# Patient Record
Sex: Male | Born: 1955
Health system: Southern US, Community
[De-identification: ages and names within clinical notes are randomized; demographics above are authoritative.]

## PROBLEM LIST (undated history)

## (undated) ENCOUNTER — Emergency Department (HOSPITAL_COMMUNITY): Admission: EM | Payer: 59 | Source: Home / Self Care

## (undated) DIAGNOSIS — R0981 Nasal congestion: Secondary | ICD-10-CM

## (undated) DIAGNOSIS — L723 Sebaceous cyst: Secondary | ICD-10-CM

## (undated) DIAGNOSIS — D170 Benign lipomatous neoplasm of skin and subcutaneous tissue of head, face and neck: Secondary | ICD-10-CM

## (undated) DIAGNOSIS — M109 Gout, unspecified: Secondary | ICD-10-CM

## (undated) DIAGNOSIS — I10 Essential (primary) hypertension: Secondary | ICD-10-CM

## (undated) DIAGNOSIS — K5790 Diverticulosis of intestine, part unspecified, without perforation or abscess without bleeding: Secondary | ICD-10-CM

## (undated) DIAGNOSIS — K5732 Diverticulitis of large intestine without perforation or abscess without bleeding: Secondary | ICD-10-CM

## (undated) DIAGNOSIS — K219 Gastro-esophageal reflux disease without esophagitis: Secondary | ICD-10-CM

## (undated) DIAGNOSIS — T7840XA Allergy, unspecified, initial encounter: Secondary | ICD-10-CM

## (undated) DIAGNOSIS — J439 Emphysema, unspecified: Secondary | ICD-10-CM

## (undated) HISTORY — DX: Allergy, unspecified, initial encounter: T78.40XA

## (undated) HISTORY — PX: OTHER SURGICAL HISTORY: SHX169

## (undated) HISTORY — DX: Sebaceous cyst: L72.3

## (undated) HISTORY — DX: Diverticulitis of large intestine without perforation or abscess without bleeding: K57.32

## (undated) HISTORY — DX: Nasal congestion: R09.81

## (undated) HISTORY — DX: Diverticulosis of intestine, part unspecified, without perforation or abscess without bleeding: K57.90

## (undated) HISTORY — DX: Essential (primary) hypertension: I10

## (undated) HISTORY — DX: Gastro-esophageal reflux disease without esophagitis: K21.9

## (undated) HISTORY — DX: Emphysema, unspecified: J43.9

## (undated) HISTORY — DX: Benign lipomatous neoplasm of skin and subcutaneous tissue of head, face and neck: D17.0

---

## 1978-05-10 HISTORY — PX: WISDOM TOOTH EXTRACTION: SHX21

## 2004-05-20 ENCOUNTER — Ambulatory Visit: Payer: Self-pay | Admitting: Internal Medicine

## 2004-05-27 ENCOUNTER — Ambulatory Visit: Payer: Self-pay

## 2005-09-06 ENCOUNTER — Ambulatory Visit: Payer: Self-pay | Admitting: Internal Medicine

## 2005-10-11 ENCOUNTER — Ambulatory Visit: Payer: Self-pay | Admitting: Internal Medicine

## 2006-03-22 ENCOUNTER — Ambulatory Visit: Payer: Self-pay | Admitting: Internal Medicine

## 2007-02-15 ENCOUNTER — Ambulatory Visit: Payer: Self-pay | Admitting: Internal Medicine

## 2007-02-15 DIAGNOSIS — J209 Acute bronchitis, unspecified: Secondary | ICD-10-CM

## 2007-02-15 DIAGNOSIS — E785 Hyperlipidemia, unspecified: Secondary | ICD-10-CM

## 2007-02-15 DIAGNOSIS — K219 Gastro-esophageal reflux disease without esophagitis: Secondary | ICD-10-CM | POA: Insufficient documentation

## 2007-02-15 DIAGNOSIS — K5730 Diverticulosis of large intestine without perforation or abscess without bleeding: Secondary | ICD-10-CM | POA: Insufficient documentation

## 2007-06-29 ENCOUNTER — Ambulatory Visit: Payer: Self-pay | Admitting: Internal Medicine

## 2007-06-29 DIAGNOSIS — C44599 Other specified malignant neoplasm of skin of other part of trunk: Secondary | ICD-10-CM

## 2007-06-29 DIAGNOSIS — Z87891 Personal history of nicotine dependence: Secondary | ICD-10-CM

## 2007-06-29 LAB — CONVERTED CEMR LAB
Cholesterol, target level: 200 mg/dL
LDL Goal: 130 mg/dL

## 2007-09-29 ENCOUNTER — Telehealth: Payer: Self-pay | Admitting: *Deleted

## 2007-10-12 ENCOUNTER — Encounter: Payer: Self-pay | Admitting: Internal Medicine

## 2007-10-12 LAB — CONVERTED CEMR LAB
ALT: 30 units/L (ref 0–53)
Albumin: 4.3 g/dL (ref 3.5–5.2)
Bilirubin, Direct: 0.2 mg/dL (ref 0.0–0.3)
CO2: 24 meq/L (ref 19–32)
Cholesterol: 206 mg/dL — ABNORMAL HIGH (ref 0–200)
Glucose, Bld: 92 mg/dL (ref 70–99)
HDL: 72 mg/dL (ref >39)
Hemoglobin, Urine: NEGATIVE
Ketones, ur: NEGATIVE mg/dL
MCHC: 33.4 g/dL (ref 30.0–36.0)
Nitrite: NEGATIVE
PSA: 0.48 ng/mL (ref 0.10–4.00)
Potassium: 4 meq/L (ref 3.5–5.3)
Protein, ur: NEGATIVE mg/dL
RBC: 4.97 M/uL (ref 4.22–5.81)
Sodium: 138 meq/L (ref 135–145)
Specific Gravity, Urine: 1.005 (ref 1.005–1.03)
TSH: 1.441 microintl units/mL (ref 0.350–5.50)
Total CHOL/HDL Ratio: 2.9
Urobilinogen, UA: 0.2 (ref 0.0–1.0)
VLDL: 40 mg/dL (ref 0–40)
WBC: 5.3 10*3/uL (ref 4.0–10.5)

## 2007-10-17 ENCOUNTER — Ambulatory Visit: Payer: Self-pay | Admitting: Internal Medicine

## 2008-04-18 ENCOUNTER — Ambulatory Visit: Payer: Self-pay | Admitting: Internal Medicine

## 2008-05-10 HISTORY — PX: COLONOSCOPY: SHX174

## 2009-01-07 ENCOUNTER — Ambulatory Visit: Payer: Self-pay | Admitting: Internal Medicine

## 2009-01-07 ENCOUNTER — Telehealth: Payer: Self-pay | Admitting: Gastroenterology

## 2009-01-07 DIAGNOSIS — L723 Sebaceous cyst: Secondary | ICD-10-CM

## 2009-01-07 DIAGNOSIS — C44300 Unspecified malignant neoplasm of skin of unspecified part of face: Secondary | ICD-10-CM | POA: Insufficient documentation

## 2009-01-07 DIAGNOSIS — C4491 Basal cell carcinoma of skin, unspecified: Secondary | ICD-10-CM | POA: Insufficient documentation

## 2009-01-07 HISTORY — DX: Sebaceous cyst: L72.3

## 2009-01-22 ENCOUNTER — Ambulatory Visit: Payer: Self-pay | Admitting: Gastroenterology

## 2009-01-30 ENCOUNTER — Ambulatory Visit: Payer: Self-pay | Admitting: Gastroenterology

## 2009-01-31 ENCOUNTER — Telehealth: Payer: Self-pay | Admitting: Gastroenterology

## 2009-02-10 ENCOUNTER — Ambulatory Visit: Payer: Self-pay | Admitting: Internal Medicine

## 2009-10-29 ENCOUNTER — Ambulatory Visit: Payer: Self-pay | Admitting: Family Medicine

## 2009-10-29 DIAGNOSIS — K5732 Diverticulitis of large intestine without perforation or abscess without bleeding: Secondary | ICD-10-CM

## 2009-10-29 DIAGNOSIS — R109 Unspecified abdominal pain: Secondary | ICD-10-CM

## 2009-10-29 HISTORY — DX: Diverticulitis of large intestine without perforation or abscess without bleeding: K57.32

## 2009-10-29 LAB — CONVERTED CEMR LAB
Basophils Absolute: 0 10*3/uL (ref 0.0–0.1)
Eosinophils Absolute: 0.2 10*3/uL (ref 0.0–0.7)
Lymphocytes Relative: 12.6 % (ref 12.0–46.0)
MCHC: 34.9 g/dL (ref 30.0–36.0)
MCV: 92.4 fL (ref 78.0–100.0)
Monocytes Absolute: 1.1 10*3/uL — ABNORMAL HIGH (ref 0.1–1.0)
Neutrophils Relative %: 76.5 % (ref 43.0–77.0)
Platelets: 225 10*3/uL (ref 150.0–400.0)
RBC: 4.53 M/uL (ref 4.22–5.81)
RDW: 12.6 % (ref 11.5–14.6)
Sed Rate: 13 mm/hr (ref 0–22)

## 2010-06-09 NOTE — Assessment & Plan Note (Signed)
Summary: diverticulitis/dm   Vital Signs:  Patient profile:   55 year old male Height:      70 inches (177.80 cm) Weight:      180 pounds (81.82 kg) BMI:     25.92 O2 Sat:      97 % on Room air Temp:     98.9 degrees F (37.17 degrees C) oral Pulse rate:   82 / minute BP sitting:   138 / 82  (left arm) Cuff size:   regular  Vitals Entered By: Josph Macho RMA (October 29, 2009 10:05 AM)  O2 Flow:  Room air CC: Diverticulitis X5 days- cramping and discomfort- pt states he is very careful with what he eats since knowing he has diverticulitis/ pt states he is no longer taking Saw Palmetto/ CF Is Patient Diabetic? No   History of Present Illness: Patient in today with worsening abdominal pain. He first noted some discomfort after a loose bowel movement almost 5 days ago. He woke up and had a loose but non bloody bowel movement and then noted some abdominal cramping. Since then he has had persistent lower abdominal pain which gets significantly worse at intervals and then improves somewhat. He describes it as cramping. He denies any f/c/malaise/bloody or tarry stool. Since that one loose stool he has just one normal stool daily. He denies any back pain or GU symptoms other than some suprapubic pain. Denies any dysuria/hesitancy/urgency or frequency.  He was first diagnosed with Diverticulitis 16 yrs ago via Sigmoidoscopy, which was performed for pain and what sounds like elevated white blood cell count. He was treated with abx then and has not had another flare until now. Did undergo a colonoscopy with Dr Russella Dar several months ago and the diverticuli were again noted.    Current Medications (verified): 1)  Prilosec 20 Mg  Cpdr (Omeprazole) .... Once Daily 2)  Saw Palmetto Plus   Caps (Misc Natural Products) .... Once Daily 3)  Salmon Oil-1000   Caps (Nutritional Supplements) .... Once Daily 4)  Vitamin B-12 Cr 1000 Mcg  Tbcr (Cyanocobalamin) .... Once Daily Off and On 5)  Co Q-10 Plus Red  Yeast Rice 60-600 Mg  Caps (Coenzyme Q10-Red Yeast Rice) .... Once Daily 6)  Mens Multivitamin Plus   Tabs (Multiple Vitamins-Minerals) .... Once Daily 7)  Milk Thistle 175 Mg Caps (Milk Thistle) .... Once Daily  Allergies (verified): 1)  ! Jonne Ply  Past History:  Past medical history reviewed for relevance to current acute and chronic problems.  Past Medical History: Reviewed history from 02/15/2007 and no changes required. 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 No apparent alopecia or balding. Ears:  External ear exam shows no significant lesions or deformities.  Otoscopic examination reveals clear canals, tympanic membranes are intact bilaterally without bulging, retraction, inflammation or discharge. Hearing is grossly normal bilaterally. Mouth:  Oral mucosa and oropharynx without lesions or exudates. MMM Neck:  No deformities, masses, or tenderness noted. Lungs:  Normal respiratory effort, chest expands symmetrically. Lungs are clear to auscultation, no crackles or wheezes. Heart:  Normal rate and regular rhythm. S1 and S2 normal without gallop, murmur, click, rub or other extra sounds. Abdomen:  soft, non-tender, no distention, no masses, no guarding, no rigidity, no rebound  tenderness, no abdominal hernia, and bowel sounds hyperactive.   Extremities:  No clubbing, cyanosis, edema, or deformity noted with normal full range of motion of all joints.   Psych:  Cognition and judgment appear intact. Alert and cooperative with normal attention span and concentration. No apparent delusions, illusions, hallucinations   Impression & Recommendations:  Problem # 1:   DIVERTICULITIS OF COLON (ICD-562.11)  Start Cipro, Flagyl and Align, maintain a bland, lowfat diet for next week and increased hydration, report worsening or persistent symptoms. If fever or worsening abdominal pain occur seek immediate medical care.  Check a CBC, ESR  Orders: Tobacco use cessation intermediate 3-10 minutes ([address])  Problem # 2:  SUPRAPUBIC PAIN (ICD-789.09)  Orders: UA Dipstick w/o Micro (automated)  (81003) TLB-CBC Platelet - w/Differential (85025-CBCD) TLB-Sedimentation Rate (ESR) (85652-ESR) Venipuncture (60454) Increase hydration  Problem # 3:  TOBACCO USE (ICD-305.1)  His updated medication list for this problem includes:    Nicotrol 10 Mg Inha (Nicotine) .Marland Kitchen... 1 cartridge by mouth q 2-4 hour as needed for cravings. max 10 cartridges daily  Patient counselled for greater than 3 minutes minutes regarding the need to stop smoking to decrease his risk of multiple cancers, stroke, heart disease, etc. He is asked to wait 10 min  with each craving prior to reaching for his inhaler if he must he should try the inhalers instead of a cigarette as he attempts to stop altogether  Orders: Tobacco use cessation intermediate 3-10 minutes (99406)  Complete Medication List: 1)  Prilosec 20 Mg Cpdr (Omeprazole) .... Once daily 2)  Saw Palmetto Plus Caps (Misc natural products) .... Once daily 3)  Salmon Oil-1000 Caps (Nutritional supplements) .... Once daily 4)  Vitamin B-12 Cr 1000 Mcg Tbcr (Cyanocobalamin) .... Once daily off and on 5)  Co Q-10 Plus Red Yeast Rice 60-600 Mg Caps (Coenzyme q10-red yeast rice) .... Once daily 6)  Mens Multivitamin Plus Tabs (Multiple vitamins-minerals) .... Once daily 7)  Milk Thistle 175 Mg Caps (Milk thistle) .... Once daily 8)  Cipro 500 Mg Tabs (Ciprofloxacin hcl) .Marland Kitchen.. 1 tab by mouth two times a day x 10days 9)  Metronidazole 500 Mg Tabs (Metronidazole) .Marland Kitchen.. 1 tab by mouth three times a day x 10 days 10)  Nicotrol 10 Mg Inha (Nicotine)  .Marland Kitchen.. 1 cartridge by mouth q 2-4 hour as needed for cravings. max 10 cartridges daily  Patient Instructions: 1)  Please schedule a follow-up appointment as needed if symptoms worsen or do not resolve. 2)  Take your antibiotic as prescribed until ALL of it is gone, but stop if you develop a rash or swelling and contact our office as soon as possible.  3)  Maintain increased hydration and a bland, lowfat diet for next week. Start Align probiotic once daily for next month. Prescriptions: NICOTROL 10 MG INHA (NICOTINE) 1 cartridge by mouth q 2-4 hour as needed for cravings. Max 10 cartridges daily  #240 x 2   Entered and Authorized by:   Danise Edge MD   Signed by:   Danise Edge MD on 10/29/2009   Method used:   Electronically to        [facility]* (retail)       [address]       [address] Shipping/mailing       [place], [place]  [address]       Ph: [phone]       Fax: [phone]   RxID:   [id number] METRONIDAZOLE  500 MG TABS (METRONIDAZOLE) 1 tab by mouth three times a day x 10 days  #30 x 0   Entered and Authorized by:   Danise Edge MD   Signed by:   Danise Edge MD on 10/29/2009   Method used:   Electronically to        [facility]* (retail)       [address]       [address]mail[address]       [place], [place]  [address]       Ph: [phone]       Fax: [phone]   RxID:   [id number] CIPRO 500 MG TABS (CIPROFLOXACIN HCL) 1 tab by mouth two times a day x 10days  #20 x 0   Entered and Authorized by:   Danise Edge MD   Signed by:   Danise Edge MD on 10/29/2009   Method used:   Electronically to        [facility]* (retail)       [address]       [address]mail[address]       [place], [place]  [address]       Ph: [phone]       Fax: [phone]   RxID:   [id number]   Appended Document: Orders Update    Clinical Lists Changes  Observations: Added new observation  of COMMENTS: Wynona Canes, CMA  October 29, 2009 11:04 AM  (10/29/2009 11:03) Added new observation of PH URINE: 5.0  (10/29/2009 11:03) Added new observation of SPEC GR URIN: >=1.030  (10/29/2009 11:03) Added new observation of APPEARANCE U: Clear  (10/29/2009 11:03) Added new observation of UA COLOR: yellow  (10/29/2009 11:03) Added new observation of WBC DIPSTK U: negative  (10/29/2009 11:03) Added new observation of NITRITE URN: negative  (10/29/2009 11:03) Added new observation of UROBILINOGEN: 0.2  (10/29/2009 11:03) Added new observation of PROTEIN, URN: trace  (10/29/2009 11:03) Added new observation of BLOOD UR DIP: negative  (10/29/2009 11:03) Added new observation of KETONES URN: negative  (10/29/2009 11:03) Added new observation of BILIRUBIN UR: negative  (10/29/2009 11:03) Added new observation of GLUCOSE, URN: negative  (10/29/2009 11:03)      Laboratory Results   Urine Tests  Date/Time Recieved: October 29, 2009 11:04 AM  Date/Time Reported: October 29, 2009 11:04 AM   Routine Urinalysis   Color: yellow Appearance: Clear Glucose: negative   (Normal Range: Negative) Bilirubin: negative   (Normal Range: Negative) Ketone: negative   (Normal Range: Negative) Spec. Gravity: >=1.030   (Normal Range: 1.003-1.035) Blood: negative   (Normal Range: Negative) pH: 5.0   (Normal Range: 5.0-8.0) Protein: trace   (Normal Range: Negative) Urobilinogen: 0.2   (Normal Range: 0-1) Nitrite: negative   (Normal Range: Negative) Leukocyte Esterace: negative   (Normal Range: Negative)    Comments: Wynona Canes, CMA  October 29, 2009 11:04 AM     Urine dip WNL, please notify patient Danise Edge MD  October 31, 2009 3:22 PM  Appended Document: diverticulitis/dm Pt is aware of lab results

## 2011-09-02 ENCOUNTER — Telehealth: Payer: Self-pay | Admitting: Internal Medicine

## 2011-09-02 NOTE — Telephone Encounter (Signed)
He is scheduled to see Dr. Lovell Sheehan on 6/5 to take a look at the mole on his leg but also wanted to have lab work done

## 2011-09-02 NOTE — Telephone Encounter (Signed)
His appoint is 5-6--may not June--he can discuss with dr Lovell Sheehan at that time

## 2011-09-02 NOTE — Telephone Encounter (Signed)
Sorry. I will make pt aware

## 2011-09-02 NOTE — Telephone Encounter (Signed)
What is he scheduled for on 6-5? It is not here

## 2011-09-02 NOTE — Telephone Encounter (Signed)
Pt had lab work done for [facility]  And some of his levels were high pt would like to have cpx labs as well as his nicotine levels checked. Pt is scheduled 6/5. Please advise

## 2011-09-02 NOTE — Telephone Encounter (Signed)
Thanks for your help.

## 2011-09-13 ENCOUNTER — Encounter: Payer: Self-pay | Admitting: Internal Medicine

## 2011-09-13 ENCOUNTER — Ambulatory Visit (INDEPENDENT_AMBULATORY_CARE_PROVIDER_SITE_OTHER): Payer: 59 | Admitting: Internal Medicine

## 2011-09-13 VITALS — BP 160/100 | HR 76 | Temp 98.2°F | Resp 16 | Ht 70.0 in | Wt 192.0 lb

## 2011-09-13 DIAGNOSIS — I10 Essential (primary) hypertension: Secondary | ICD-10-CM

## 2011-09-13 DIAGNOSIS — E781 Pure hyperglyceridemia: Secondary | ICD-10-CM

## 2011-09-13 DIAGNOSIS — L719 Rosacea, unspecified: Secondary | ICD-10-CM

## 2011-09-13 DIAGNOSIS — R635 Abnormal weight gain: Secondary | ICD-10-CM

## 2011-09-13 MED ORDER — ESZOPICLONE 2 MG PO TABS: 2.0000 mg | ORAL_TABLET | Freq: Every evening | ORAL | Status: DC | PRN

## 2011-09-13 MED ORDER — OLMESARTAN MEDOXOMIL 20 MG PO TABS: 20.0000 mg | ORAL_TABLET | Freq: Every day | ORAL | Status: DC

## 2011-09-13 MED ORDER — CLINDAMYCIN PHOSPHATE 1 % EX SOLN: Freq: Two times a day (BID) | CUTANEOUS | Status: DC

## 2011-09-13 MED ORDER — INOSITOL NIACINATE 500 MG PO CAPS: 1.0000 | ORAL_CAPSULE | Freq: Every day | ORAL | Status: DC

## 2011-09-13 MED ORDER — OMEPRAZOLE 20 MG PO CPDR: 20.0000 mg | DELAYED_RELEASE_CAPSULE | Freq: Every day | ORAL | Status: DC

## 2011-09-13 NOTE — Patient Instructions (Signed)
Biggest factor that will affect your triglycerides will be weight loss Portion control Exercise... WALK WITH TERRY Look at [facility] diet  For ideas about weight management and high blood pressure management Start the benicar every AM

## 2011-11-26 ENCOUNTER — Other Ambulatory Visit (INDEPENDENT_AMBULATORY_CARE_PROVIDER_SITE_OTHER): Payer: 59

## 2011-11-26 DIAGNOSIS — Z0000 Encounter for general adult medical examination without abnormal findings: Secondary | ICD-10-CM

## 2011-11-26 LAB — HEPATIC FUNCTION PANEL
ALT: 47 U/L (ref 0–53)
AST: 41 U/L — ABNORMAL HIGH (ref 0–37)
Alkaline Phosphatase: 58 U/L (ref 39–117)
Bilirubin, Direct: 0.1 mg/dL (ref 0.0–0.3)
Total Bilirubin: 0.7 mg/dL (ref 0.3–1.2)
Total Protein: 7.3 g/dL (ref 6.0–8.3)

## 2011-11-26 LAB — CBC WITH DIFFERENTIAL/PLATELET
Basophils Relative: 0.8 % (ref 0.0–3.0)
Eosinophils Relative: 8.2 % — ABNORMAL HIGH (ref 0.0–5.0)
Lymphocytes Relative: 34.8 % (ref 12.0–46.0)
MCV: 91.5 fl (ref 78.0–100.0)
Monocytes Relative: 8.8 % (ref 3.0–12.0)
Neutrophils Relative %: 47.4 % (ref 43.0–77.0)
Platelets: 225 10*3/uL (ref 150.0–400.0)
RBC: 4.38 Mil/uL (ref 4.22–5.81)
WBC: 4.9 10*3/uL (ref 4.5–10.5)

## 2011-11-26 LAB — POCT URINALYSIS DIPSTICK
Bilirubin, UA: NEGATIVE
Leukocytes, UA: NEGATIVE
Nitrite, UA: NEGATIVE
Protein, UA: NEGATIVE
Urobilinogen, UA: 0.2
pH, UA: 5.5

## 2011-11-26 LAB — BASIC METABOLIC PANEL
BUN: 14 mg/dL (ref 6–23)
Calcium: 9.1 mg/dL (ref 8.4–10.5)
Chloride: 102 mEq/L (ref 96–112)
Creatinine, Ser: 0.9 mg/dL (ref 0.4–1.5)
GFR: 95.09 mL/min (ref >60.00)

## 2011-11-26 LAB — LIPID PANEL: Total CHOL/HDL Ratio: 3

## 2011-12-03 ENCOUNTER — Encounter: Payer: Self-pay | Admitting: Internal Medicine

## 2011-12-03 ENCOUNTER — Ambulatory Visit (INDEPENDENT_AMBULATORY_CARE_PROVIDER_SITE_OTHER): Payer: 59 | Admitting: Internal Medicine

## 2011-12-03 VITALS — BP 124/80 | HR 80 | Temp 98.6°F | Resp 16 | Ht 70.0 in | Wt 183.0 lb

## 2011-12-03 DIAGNOSIS — I10 Essential (primary) hypertension: Secondary | ICD-10-CM

## 2011-12-03 DIAGNOSIS — Z0000 Encounter for general adult medical examination without abnormal findings: Secondary | ICD-10-CM

## 2011-12-03 NOTE — Progress Notes (Signed)
Subjective:    Patient ID: Angel Ellis, male    DOB: 08/10/1955, 56 y.o.   MRN: [id number]  HPI Patient presents for complete physical examination he is treated for hypertension his history of hyperlipidemia he has unfortunately continued to use tobacco and has a mild history of allergic rhinitis and bronchospasm   Review of Systems  Constitutional: Negative for fever and fatigue.  HENT: Negative for hearing loss, congestion, neck pain and postnasal drip.   Eyes: Negative for discharge, redness and visual disturbance.  Respiratory: Negative for cough, shortness of breath and wheezing.   Cardiovascular: Negative for leg swelling.  Gastrointestinal: Negative for abdominal pain, constipation and abdominal distention.  Genitourinary: Negative for urgency and frequency.  Musculoskeletal: Negative for joint swelling and arthralgias.  Skin: Negative for color change and rash.  Neurological: Negative for weakness and light-headedness.  Hematological: Negative for adenopathy.  Psychiatric/Behavioral: Negative for behavioral problems.   Past Medical History  Diagnosis Date  . GERD (gastroesophageal reflux disease)   . Hyperlipidemia   . Diverticulosis     History   Social History  . Marital Status: Married    Spouse Name: N/A    Number of Children: N/A  . Years of Education: N/A   Occupational History  . Not on file.   Social History Main Topics  . Smoking status: Current Everyday Smoker  . Smokeless tobacco: Not on file  . Alcohol Use: Not on file  . Drug Use: Not on file  . Sexually Active: Not on file   Other Topics Concern  . Not on file   Social History Narrative  . No narrative on file    No past surgical history on file.  Family History  Problem Relation Age of Onset  . Heart disease Mother   . Heart disease Father     Allergies  Allergen Reactions  . Aspirin     REACTION: Abd cramping    Current Outpatient Prescriptions on File Prior to Visit    Medication Sig Dispense Refill  . calcium citrate-vitamin Angel 200-200 MG-UNIT TABS Take 1 tablet by mouth daily.      . cetirizine (ZYRTEC) 10 MG tablet Take 5 mg by mouth daily.      . clindamycin (CLEOCIN-T) 1 % external solution Apply topically 2 (two) times daily.  30 mL  5  . eszopiclone (LUNESTA) 2 MG TABS Take 1 tablet (2 mg total) by mouth at bedtime as needed. Take immediately before bedtime  30 tablet  0  . Inositol Niacinate (NIACIN FLUSH FREE) 500 MG CAPS Take 1 capsule (500 mg total) by mouth daily.  180 each  0  . milk thistle 175 MG tablet Take 175 mg by mouth daily.      . Misc Natural Products (SAW PALMETTO PLUS PO) Take by mouth daily.      . Multiple Vitamins-Minerals (MULTIVITAMIN PO) Take by mouth daily.      Marland Kitchen olmesartan (BENICAR) 20 MG tablet Take 1 tablet (20 mg total) by mouth daily.      Marland Kitchen omeprazole (PRILOSEC) 20 MG capsule Take 1 capsule (20 mg total) by mouth daily.  30 capsule  11    BP 124/80  Pulse 80  Temp 98.6 F (37 C)  Resp 16  Ht 5\' 10"  (1.778 m)  Wt 183 lb (83.008 kg)  BMI 26.26 kg/m2       Objective:   Physical Exam  Nursing note and vitals reviewed. Constitutional: He appears well-developed and well-nourished.  HENT:  Head: Normocephalic and atraumatic.  Eyes: Conjunctivae are normal. Pupils are equal, round, and reactive to light.  Neck: Normal range of motion. Neck supple.  Cardiovascular: Normal rate and regular rhythm.   Pulmonary/Chest: Effort normal and breath sounds normal.  Abdominal: Soft. Bowel sounds are normal.   Prostate is normal in size and consistency deep tendon reflexes are +2 and equal bilaterally there is no peripheral edema skin is normal       Assessment & Plan:   Patient presents for yearly preventative medicine examination.   all immunizations and health maintenance protocols were reviewed with the patient and they are up to date with these protocols.   screening laboratory values were reviewed with the  patient including screening of hyperlipidemia PSA renal function and hepatic function.   There medications past medical history social history problem list and allergies were reviewed in detail.   Goals were established with regard to weight loss exercise diet in compliance with medications   Review Cole for lipids and weight loss blood pressure is stable on current medication

## 2011-12-15 ENCOUNTER — Other Ambulatory Visit: Payer: Self-pay | Admitting: *Deleted

## 2011-12-15 DIAGNOSIS — I10 Essential (primary) hypertension: Secondary | ICD-10-CM

## 2011-12-15 MED ORDER — OLMESARTAN MEDOXOMIL 20 MG PO TABS: 20.0000 mg | ORAL_TABLET | Freq: Every day | ORAL | Status: DC

## 2012-02-02 ENCOUNTER — Encounter: Payer: Self-pay | Admitting: Internal Medicine

## 2012-02-02 NOTE — Progress Notes (Signed)
Subjective:    Patient ID: Angel Ellis, male    DOB: 1955/09/26, 56 y.o.   MRN: [id number]  HPI Patient is a 56 year old male who presents for followup of diagnosis of hypertension and also for observation of a mole on his leg.  He denies any chest pain shortness of breath PND orthopnea his blood pressure elevation was detected by screening at work   Review of Systems  Constitutional: Negative for fever and fatigue.  HENT: Negative for hearing loss, congestion, neck pain and postnasal drip.   Eyes: Negative for discharge, redness and visual disturbance.  Respiratory: Negative for cough, shortness of breath and wheezing.   Cardiovascular: Negative for leg swelling.  Gastrointestinal: Negative for abdominal pain, constipation and abdominal distention.  Genitourinary: Negative for urgency and frequency.  Musculoskeletal: Negative for joint swelling and arthralgias.  Skin: Positive for color change. Negative for rash.  Neurological: Negative for weakness and light-headedness.  Hematological: Negative for adenopathy.  Psychiatric/Behavioral: Negative for behavioral problems.   Past Medical History  Diagnosis Date  . GERD (gastroesophageal reflux disease)   . Hyperlipidemia   . Diverticulosis     History   Social History  . Marital Status: Married    Spouse Name: N/A    Number of Children: N/A  . Years of Education: N/A   Occupational History  . Not on file.   Social History Main Topics  . Smoking status: Current Every Day Smoker  . Smokeless tobacco: Not on file  . Alcohol Use: Not on file  . Drug Use: Not on file  . Sexually Active: Not on file   Other Topics Concern  . Not on file   Social History Narrative  . No narrative on file    No past surgical history on file.  Family History  Problem Relation Age of Onset  . Heart disease Mother   . Heart disease Father     Allergies  Allergen Reactions  . Aspirin     REACTION: Abd cramping    Current  Outpatient Prescriptions on File Prior to Visit  Medication Sig Dispense Refill  . cetirizine (ZYRTEC) 10 MG tablet Take 5 mg by mouth daily.      Marland Kitchen omeprazole (PRILOSEC) 20 MG capsule Take 1 capsule (20 mg total) by mouth daily.  30 capsule  11  . calcium citrate-vitamin Angel 200-200 MG-UNIT TABS Take 1 tablet by mouth daily.      . eszopiclone (LUNESTA) 2 MG TABS Take 1 tablet (2 mg total) by mouth at bedtime as needed. Take immediately before bedtime  30 tablet  0  . olmesartan (BENICAR) 20 MG tablet Take 1 tablet (20 mg total) by mouth daily.  90 tablet  3    BP 160/100  Pulse 76  Temp 98.2 F (36.8 C)  Resp 16  Ht 5\' 10"  (1.778 m)  Wt 192 lb (87.091 kg)  BMI 27.55 kg/m2       Objective:   Physical Exam  Constitutional: He appears well-developed and well-nourished.  HENT:  Head: Normocephalic and atraumatic.  Eyes: Conjunctivae normal are normal. Pupils are equal, round, and reactive to light.  Neck: Normal range of motion. Neck supple.  Cardiovascular: Normal rate and regular rhythm.   Pulmonary/Chest: Effort normal and breath sounds normal.  Abdominal: Soft. Bowel sounds are normal.  Skin:       Mole on leg          Assessment & Plan:  Discussion of essential hypertension appropriate monitoring labs.  Risk factors include age mild hyperlipidemia and hypertension.  Discussed increased cardiovascular risks and the need to treat high blood pressure.  Begin Benicar 20 mg by mouth daily and monitor blood pressure at work report if systolic blood pressure continues to be greater than 140 or diastolic blood pressure greater than 90 followup in 2 months time.   I have spent more than 30 minutes examining this patient face-to-face of which over half was spent in counseling

## 2012-04-25 ENCOUNTER — Ambulatory Visit (INDEPENDENT_AMBULATORY_CARE_PROVIDER_SITE_OTHER): Payer: Commercial Managed Care - PPO | Admitting: General Surgery

## 2012-04-25 ENCOUNTER — Encounter (INDEPENDENT_AMBULATORY_CARE_PROVIDER_SITE_OTHER): Payer: Self-pay | Admitting: General Surgery

## 2012-04-25 VITALS — BP 128/76 | HR 72 | Temp 98.4°F | Resp 18 | Ht 70.0 in | Wt 186.1 lb

## 2012-04-25 DIAGNOSIS — D170 Benign lipomatous neoplasm of skin and subcutaneous tissue of head, face and neck: Secondary | ICD-10-CM

## 2012-04-25 DIAGNOSIS — D1739 Benign lipomatous neoplasm of skin and subcutaneous tissue of other sites: Secondary | ICD-10-CM

## 2012-04-25 NOTE — Progress Notes (Signed)
Patient ID: Angel Ellis, male   DOB: [birth year], 56 y.o.   MRN: [id number]  Chief Complaint  Patient presents with  . Lipoma    HPI Angel Ellis is a 56 y.o. male.  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 He has a 3-4 year history of a right supraclavicular mass that is soft, mobile, and causes him no symptoms at all. This is not changed over that time. All. He comes in today to have this evaluated to see if he should have it removed. Past Medical History  Diagnosis Date  . GERD (gastroesophageal reflux disease)   . Diverticulosis   . Hypertension   . Nasal congestion   . Lipoma of neck     History reviewed. No pertinent past surgical history.  Family History  Problem Relation Age of Onset  . Heart disease Mother   . Heart disease Father     Social History History  Substance Use Topics  . Smoking status: Former Smoker    Quit date: 12/08/2009  . Smokeless tobacco: Never Used  . Alcohol Use: Yes     Comment: one per day    Allergies  Allergen Reactions  . Aspirin Other (See Comments)    Abdominal cramping, gi discomfort  . Penicillins Palpitations    Makes heart race    Current Outpatient Prescriptions  Medication Sig Dispense Refill  . Inositol Niacinate (NIACIN FLUSH FREE) 500 MG CAPS Take 1 capsule (500 mg total) by mouth daily.  180 each  0  . milk thistle 175 MG tablet Take 175 mg by mouth daily.      . Misc Natural Products (SAW PALMETTO PLUS PO) Take by mouth daily.      . Multiple Vitamins-Minerals (MULTIVITAMIN PO) Take by mouth daily.      Angel Ellis olmesartan (BENICAR) 20 MG tablet Take 1 tablet (20 mg total) by mouth daily.  90 tablet  3  . omeprazole (PRILOSEC) 20 MG capsule Take 1 capsule (20 mg total) by mouth daily.  30 capsule  11    Review of Systems Review of Systems  Constitutional: Negative for fever, chills and unexpected weight change.  HENT: Positive for congestion. Negative for hearing loss, sore  throat, trouble swallowing and voice change.   Eyes: Negative for visual disturbance.  Respiratory: Negative for cough and wheezing.   Cardiovascular: Negative for chest pain, palpitations and leg swelling.  Gastrointestinal: Negative for nausea, vomiting, abdominal pain, diarrhea, constipation, blood in stool, abdominal distention, anal bleeding and rectal pain.  Genitourinary: Negative for hematuria and difficulty urinating.  Musculoskeletal: Negative for arthralgias.  Skin: Negative for rash and wound.  Neurological: Negative for seizures, syncope, weakness and headaches.  Hematological: Negative for adenopathy. Does not bruise/bleed easily.  Psychiatric/Behavioral: Negative for confusion.    Blood pressure 128/76, pulse 72, temperature 98.4 F (36.9 C), temperature source Temporal, resp. rate 18, height 5\' 10"  (1.778 m), weight 186 lb 2 oz (84.426 kg).  Physical Exam Physical Exam  Vitals reviewed. Constitutional: He appears well-developed and well-nourished.  Neck: Neck supple.        Assessment    Neck lipoma    Plan    This area is a lipoma on my exam today. There are no concerning features of bilateral my exam it is not have a period of rapid growth associated with it all. It is causing absolutely no symptoms to him now as well. I discussed excising the spotting  is reasonable just to keep and I on this for now. He is very comfortable with that and is going to let me know if he begins causing him symptoms or if it grows.       Alize Borrayo 04/25/2012, 9:27 AM

## 2012-06-05 ENCOUNTER — Ambulatory Visit: Payer: 59 | Admitting: Internal Medicine

## 2012-08-18 ENCOUNTER — Ambulatory Visit: Payer: 59 | Admitting: Internal Medicine

## 2012-09-14 ENCOUNTER — Other Ambulatory Visit: Payer: Self-pay | Admitting: Internal Medicine

## 2012-10-08 ENCOUNTER — Encounter (HOSPITAL_COMMUNITY): Payer: Self-pay | Admitting: *Deleted

## 2012-10-08 ENCOUNTER — Emergency Department (HOSPITAL_COMMUNITY)
Admission: EM | Admit: 2012-10-08 | Discharge: 2012-10-08 | Disposition: A | Discharge status: Home / Self Care | Payer: 59 | Attending: Emergency Medicine | Admitting: Emergency Medicine

## 2012-10-08 DIAGNOSIS — M25476 Effusion, unspecified foot: Secondary | ICD-10-CM | POA: Insufficient documentation

## 2012-10-08 DIAGNOSIS — M109 Gout, unspecified: Secondary | ICD-10-CM | POA: Insufficient documentation

## 2012-10-08 DIAGNOSIS — Z8719 Personal history of other diseases of the digestive system: Secondary | ICD-10-CM | POA: Insufficient documentation

## 2012-10-08 DIAGNOSIS — K219 Gastro-esophageal reflux disease without esophagitis: Secondary | ICD-10-CM | POA: Insufficient documentation

## 2012-10-08 DIAGNOSIS — Z87891 Personal history of nicotine dependence: Secondary | ICD-10-CM | POA: Insufficient documentation

## 2012-10-08 DIAGNOSIS — M25473 Effusion, unspecified ankle: Secondary | ICD-10-CM | POA: Insufficient documentation

## 2012-10-08 DIAGNOSIS — Z872 Personal history of diseases of the skin and subcutaneous tissue: Secondary | ICD-10-CM | POA: Insufficient documentation

## 2012-10-08 DIAGNOSIS — R21 Rash and other nonspecific skin eruption: Secondary | ICD-10-CM | POA: Insufficient documentation

## 2012-10-08 DIAGNOSIS — I10 Essential (primary) hypertension: Secondary | ICD-10-CM | POA: Insufficient documentation

## 2012-10-08 DIAGNOSIS — L539 Erythematous condition, unspecified: Secondary | ICD-10-CM | POA: Insufficient documentation

## 2012-10-08 DIAGNOSIS — Z79899 Other long term (current) drug therapy: Secondary | ICD-10-CM | POA: Insufficient documentation

## 2012-10-08 MED ORDER — INDOMETHACIN 25 MG PO CAPS: 25.0000 mg | ORAL_CAPSULE | Freq: Three times a day (TID) | ORAL | Status: DC

## 2012-10-08 MED ORDER — COLCHICINE 0.6 MG PO TABS: ORAL_TABLET | ORAL | Status: DC

## 2012-10-08 MED ORDER — COLCHICINE 0.6 MG PO TABS
1.2000 mg | ORAL_TABLET | Freq: Once | ORAL | Status: AC
  Administered 2012-10-08: 1.2 mg via ORAL
  Filled 2012-10-08: qty 2

## 2012-10-08 MED ORDER — OXYCODONE-ACETAMINOPHEN 5-325 MG PO TABS: 1.0000 | ORAL_TABLET | ORAL | Status: DC | PRN

## 2012-10-08 NOTE — ED Provider Notes (Signed)
History     CSN: [id number]  Arrival date & time 10/08/12  0710   First MD Initiated Contact with Patient 10/08/12 0725      Chief Complaint  Patient presents with  . Ankle Pain    left    (Consider location/radiation/quality/duration/timing/severity/associated sxs/prior treatment) HPI Comments: Pt c/o pain in the L ankle and foot - onset 2 days ago - gradual in onset, persistent, mild to moderate, gradually worsening adn not associated with fevesr, vomiting or other joint pain.  He has never had surgery there, no hx of gout adn no trauma / insect bites.  Patient is a 57 y.o. male presenting with ankle pain. The history is provided by the patient.  Ankle Pain   Past Medical History  Diagnosis Date  . GERD (gastroesophageal reflux disease)   . Diverticulosis   . Hypertension   . Nasal congestion   . Lipoma of neck     History reviewed. No pertinent past surgical history.  Family History  Problem Relation Age of Onset  . Heart disease Mother   . Heart disease Father     History  Substance Use Topics  . Smoking status: Former Smoker    Quit date: 12/08/2009  . Smokeless tobacco: Never Used  . Alcohol Use: Yes     Comment: one per day      Review of Systems  Musculoskeletal: Positive for joint swelling.  Skin: Positive for rash.    Allergies  Aspirin and Penicillins  Home Medications   Current Outpatient Rx  Name  Route  Sig  Dispense  Refill  . Misc Natural Products (SAW PALMETTO PLUS PO)   Oral   Take by mouth daily.         . Multiple Vitamins-Minerals (MULTIVITAMIN PO)   Oral   Take by mouth daily.         Marland Kitchen olmesartan (BENICAR) 20 MG tablet   Oral   Take 1 tablet (20 mg total) by mouth daily.   90 tablet   3   . omeprazole (PRILOSEC) 20 MG capsule      TAKE 1 CAPSULE BY MOUTH DAILY.   30 capsule   11   . colchicine 0.6 MG tablet      Take 0.6mg  (one tablet) by mouth every 1-2 hours until one of the following occurs: 1.  The pain  is gone 2.  The maximum dose has been given ( no more than 3 tabs in 3 hours or 10 tabs in 24 hours) 3.  The side effects outweight the benefits   10 tablet   0   . indomethacin (INDOCIN) 25 MG capsule   Oral   Take 1 capsule (25 mg total) by mouth 3 (three) times daily with meals. May take up to 50mg  three times a day if no improvement with 25mg .   30 capsule   0   . Inositol Niacinate (NIACIN FLUSH FREE) 500 MG CAPS   Oral   Take 1 capsule (500 mg total) by mouth daily.   180 each   0   . milk thistle 175 MG tablet   Oral   Take 175 mg by mouth daily.         Marland Kitchen oxyCODONE-acetaminophen (PERCOCET) 5-325 MG per tablet   Oral   Take 1 tablet by mouth every 4 (four) hours as needed for pain.   20 tablet   0     BP 137/89  Pulse 81  Temp(Src) 97.9 F (36.6  C) (Oral)  Resp 18  SpO2 97%  Physical Exam  Nursing note and vitals reviewed. Constitutional: He appears well-developed and well-nourished. No distress.  HENT:  Head: Normocephalic and atraumatic.  Eyes: Conjunctivae are normal. Right eye exhibits no discharge. Left eye exhibits no discharge. No scleral icterus.  Pulmonary/Chest: Effort normal.  Musculoskeletal: He exhibits tenderness ( mild ttp over the L lateral foot - mild redness here, no induration). He exhibits no edema.  Mild dec ROM of the L foot with dorsiflexion / normal eversion / invertsion.    Neurological: He is alert. Coordination normal.  Skin: Skin is warm and dry. There is erythema ( hint of erythema to the L lateral foot).    ED Course  Procedures (including critical care time)  Labs Reviewed - No data to display No results found.   1. Gout attack       MDM  Overall well appaering, likely early gout - no signs of septic arthritis or cellulitis.   Meds given in ED:  Medications  colchicine tablet 1.2 mg (not administered)    New Prescriptions   COLCHICINE 0.6 MG TABLET    Take 0.6mg  (one tablet) by mouth every 1-2 hours until  one of the following occurs: 1.  The pain is gone 2.  The maximum dose has been given ( no more than 3 tabs in 3 hours or 10 tabs in 24 hours) 3.  The side effects outweight the benefits   INDOMETHACIN (INDOCIN) 25 MG CAPSULE    Take 1 capsule (25 mg total) by mouth 3 (three) times daily with meals. May take up to 50mg  three times a day if no improvement with 25mg .   OXYCODONE-ACETAMINOPHEN (PERCOCET) 5-325 MG PER TABLET    Take 1 tablet by mouth every 4 (four) hours as needed for pain.            Vida Roller, MD 10/08/12 0730

## 2012-10-08 NOTE — ED Notes (Signed)
Pt states this past Friday he noticed pain and redness to his left lateral foot from his heel, extending downward towards his toes.  Swelling and redness noted to foot, good pulse and not exceptionally warm to touch.  Pt denies N/V and fever.  Pt denies injury to area.  There are 2 small red dots on left lateral ankle - pt is not aware that he has been bitten by an insect or any sort.

## 2012-10-10 ENCOUNTER — Ambulatory Visit (INDEPENDENT_AMBULATORY_CARE_PROVIDER_SITE_OTHER): Payer: 59 | Admitting: Family Medicine

## 2012-10-10 ENCOUNTER — Encounter: Payer: Self-pay | Admitting: Family Medicine

## 2012-10-10 VITALS — BP 120/80 | HR 98 | Temp 98.5°F | Wt 182.0 lb

## 2012-10-10 DIAGNOSIS — M109 Gout, unspecified: Secondary | ICD-10-CM

## 2012-10-10 LAB — URIC ACID: Uric Acid, Serum: 8.6 mg/dL — ABNORMAL HIGH (ref 4.0–7.8)

## 2012-10-10 NOTE — Progress Notes (Signed)
  Subjective:    Patient ID: Angel Ellis, male    DOB: 03-18-1956, 58 y.o.   MRN: [id number]  HPI Here to follow up an ER visit on 10-08-12 for the sudden onset of pain and swelling in the left foot. This was diagnosed as gout, and he was treated with Colchicine, Indocin, and percocet. The foot feels much better now although it is still mildly painful. No recent trauma. He has never had this before. He thinks it came from eating a lot of asparagus lately.    Review of Systems  Constitutional: Negative.   Musculoskeletal: Positive for joint swelling and arthralgias.       Objective:   Physical Exam  Constitutional: He appears well-developed and well-nourished.  Musculoskeletal:  The left lateral foot is red, warm, swollen, and mildly tender          Assessment & Plan:  His acute gout episode is improving. Get a uric acid level today.

## 2012-10-11 NOTE — Progress Notes (Signed)
Quick Note:  I left voice message with results. ______ 

## 2012-10-23 ENCOUNTER — Other Ambulatory Visit: Payer: Self-pay | Admitting: Family Medicine

## 2012-10-23 ENCOUNTER — Telehealth: Payer: Self-pay | Admitting: Internal Medicine

## 2012-10-23 NOTE — Telephone Encounter (Signed)
Pt is at [facility] on [address]. He is trying to refill the colchicine but it was filled by the ED. Pharm states we must call pt or pharm ASAP to talk about filling this. Thanks.

## 2012-10-23 NOTE — Telephone Encounter (Signed)
refilled 

## 2012-10-31 ENCOUNTER — Telehealth: Payer: Self-pay | Admitting: Internal Medicine

## 2012-10-31 DIAGNOSIS — M109 Gout, unspecified: Secondary | ICD-10-CM

## 2012-10-31 NOTE — Telephone Encounter (Signed)
PT called to request a repeat lab for his uric acid level, to be ordered. So that he could have it re-tested, so that he knows where he stands. PT states that his gout symptoms are completely gone, but he would like to have labs drawn to have more details. He states that he works at [employer], and can walk over and have labs done. Please assist.

## 2012-10-31 NOTE — Telephone Encounter (Signed)
Pt informed- uric acid ordered under labs

## 2012-12-01 ENCOUNTER — Telehealth: Payer: Self-pay | Admitting: Internal Medicine

## 2012-12-01 NOTE — Telephone Encounter (Signed)
Sorry but I am too full  

## 2012-12-01 NOTE — Telephone Encounter (Signed)
Pt would like to switch to Dr Clent Ridges. Can I sch?

## 2012-12-04 NOTE — Telephone Encounter (Signed)
lmom for pt to call back

## 2012-12-04 NOTE — Telephone Encounter (Signed)
Pt is aware.  

## 2012-12-21 ENCOUNTER — Other Ambulatory Visit: Payer: Self-pay | Admitting: Internal Medicine

## 2013-01-26 ENCOUNTER — Other Ambulatory Visit: Payer: 59

## 2013-02-02 ENCOUNTER — Encounter: Payer: 59 | Admitting: Internal Medicine

## 2013-03-09 ENCOUNTER — Other Ambulatory Visit: Payer: Self-pay | Admitting: Internal Medicine

## 2013-03-14 ENCOUNTER — Other Ambulatory Visit (INDEPENDENT_AMBULATORY_CARE_PROVIDER_SITE_OTHER): Payer: 59

## 2013-03-14 DIAGNOSIS — Z0000 Encounter for general adult medical examination without abnormal findings: Secondary | ICD-10-CM

## 2013-03-14 DIAGNOSIS — M109 Gout, unspecified: Secondary | ICD-10-CM

## 2013-03-14 LAB — CBC WITH DIFFERENTIAL/PLATELET
Basophils Absolute: 0.1 10*3/uL (ref 0.0–0.1)
Basophils Relative: 1 % (ref 0.0–3.0)
Eosinophils Absolute: 0.3 10*3/uL (ref 0.0–0.7)
Lymphocytes Relative: 32.3 % (ref 12.0–46.0)
MCHC: 34.3 g/dL (ref 30.0–36.0)
Neutrophils Relative %: 52.2 % (ref 43.0–77.0)
Platelets: 230 10*3/uL (ref 150.0–400.0)
RBC: 4.36 Mil/uL (ref 4.22–5.81)

## 2013-03-14 LAB — LIPID PANEL
Cholesterol: 226 mg/dL — ABNORMAL HIGH (ref 0–200)
HDL: 66.5 mg/dL (ref >39.00)
VLDL: 35.6 mg/dL (ref 0.0–40.0)

## 2013-03-14 LAB — POCT URINALYSIS DIPSTICK
Bilirubin, UA: NEGATIVE
Glucose, UA: NEGATIVE
Ketones, UA: NEGATIVE
Leukocytes, UA: NEGATIVE
Nitrite, UA: NEGATIVE

## 2013-03-14 LAB — BASIC METABOLIC PANEL
BUN: 13 mg/dL (ref 6–23)
CO2: 29 mEq/L (ref 19–32)
Calcium: 9.4 mg/dL (ref 8.4–10.5)
Creatinine, Ser: 0.9 mg/dL (ref 0.4–1.5)

## 2013-03-14 LAB — HEPATIC FUNCTION PANEL
Alkaline Phosphatase: 50 U/L (ref 39–117)
Bilirubin, Direct: 0.2 mg/dL (ref 0.0–0.3)
Total Bilirubin: 0.9 mg/dL (ref 0.3–1.2)

## 2013-03-14 LAB — PSA: PSA: 0.66 ng/mL (ref 0.10–4.00)

## 2013-03-20 ENCOUNTER — Encounter: Payer: 59 | Admitting: Internal Medicine

## 2013-03-21 ENCOUNTER — Ambulatory Visit (INDEPENDENT_AMBULATORY_CARE_PROVIDER_SITE_OTHER): Payer: 59 | Admitting: Internal Medicine

## 2013-03-21 ENCOUNTER — Encounter: Payer: 59 | Admitting: Internal Medicine

## 2013-03-21 ENCOUNTER — Encounter: Payer: Self-pay | Admitting: Internal Medicine

## 2013-03-21 VITALS — BP 130/70 | HR 76 | Temp 98.2°F | Resp 16 | Ht 70.0 in | Wt 182.0 lb

## 2013-03-21 DIAGNOSIS — E785 Hyperlipidemia, unspecified: Secondary | ICD-10-CM

## 2013-03-21 DIAGNOSIS — Z0000 Encounter for general adult medical examination without abnormal findings: Secondary | ICD-10-CM

## 2013-03-21 MED ORDER — ROSUVASTATIN CALCIUM 20 MG PO TABS: 20.0000 mg | ORAL_TABLET | ORAL | Status: DC

## 2013-03-21 NOTE — Progress Notes (Signed)
Subjective:    Patient ID: Angel Ellis, male    DOB: 02/11/1956, 57 y.o.   MRN: [id number]  HPI CPX wellness exam Gout flair with diet changes     Review of Systems  Constitutional: Negative for fever and fatigue.  HENT: Negative for congestion, hearing loss and postnasal drip.   Eyes: Negative for discharge, redness and visual disturbance.  Respiratory: Negative for cough, shortness of breath and wheezing.   Cardiovascular: Negative for leg swelling.  Gastrointestinal: Negative for abdominal pain, constipation and abdominal distention.  Genitourinary: Negative for urgency and frequency.  Musculoskeletal: Negative for arthralgias, joint swelling and neck pain.  Skin: Negative for color change and rash.  Neurological: Negative for weakness and light-headedness.  Hematological: Negative for adenopathy.  Psychiatric/Behavioral: Negative for behavioral problems.   Past Medical History  Diagnosis Date  . GERD (gastroesophageal reflux disease)   . Diverticulosis   . Hypertension   . Nasal congestion   . Lipoma of neck     History   Social History  . Marital Status: Single    Spouse Name: N/A    Number of Children: N/A  . Years of Education: N/A   Occupational History  . Not on file.   Social History Main Topics  . Smoking status: Former Smoker    Quit date: 12/08/2009  . Smokeless tobacco: Never Used  . Alcohol Use: Yes     Comment: one per day  . Drug Use: No  . Sexual Activity: Not on file   Other Topics Concern  . Not on file   Social History Narrative  . No narrative on file    No past surgical history on file.  Family History  Problem Relation Age of Onset  . Heart disease Mother   . Heart disease Father     Allergies  Allergen Reactions  . Aspirin Other (See Comments)    Abdominal cramping, gi discomfort  . Penicillins Palpitations    Makes heart race    Current Outpatient Prescriptions on File Prior to Visit  Medication Sig Dispense  Refill  . BENICAR 20 MG tablet TAKE 1 TABLET BY MOUTH ONCE DAILY  90 tablet  3  . COLCRYS 0.6 MG tablet take 1 tablet by mouth every 1 to 2 hours UNTIL ONE OF THE FOLLOWING OCCURS.1.THE PAIN IS GONE.OR 2.THE MAXIMUM DOSE GIVEN IS NO MORE THAN 3  10 tablet  0  . indomethacin (INDOCIN) 25 MG capsule take 1 capsule by mouth three times a day with food MAY TAKE UPTO 2 CAPSULES THREE TIMES A DAY IF NO IMPROVEMENTS WITH 1 CAPSULE  30 capsule  3  . Inositol Niacinate (NIACIN FLUSH FREE) 500 MG CAPS Take 1 capsule (500 mg total) by mouth daily.  180 each  0  . milk thistle 175 MG tablet Take 175 mg by mouth daily.      . Misc Natural Products (SAW PALMETTO PLUS PO) Take by mouth daily.      . Multiple Vitamins-Minerals (MULTIVITAMIN PO) Take by mouth daily.      Marland Kitchen omeprazole (PRILOSEC) 20 MG capsule TAKE 1 CAPSULE BY MOUTH DAILY.  30 capsule  11  . oxyCODONE-acetaminophen (PERCOCET) 5-325 MG per tablet Take 1 tablet by mouth every 4 (four) hours as needed for pain.  20 tablet  0   No current facility-administered medications on file prior to visit.    BP 130/70  Pulse 76  Temp(Src) 98.2 F (36.8 C)  Resp 16  Ht 5\' 10"  (  1.778 m)  Wt 182 lb (82.555 kg)  BMI 26.11 kg/m2       Objective:   Physical Exam  Nursing note and vitals reviewed. Constitutional: He is oriented to person, place, and time. He appears well-developed and well-nourished.  HENT:  Head: Normocephalic and atraumatic.  Eyes: Conjunctivae are normal. Pupils are equal, round, and reactive to light.  Neck: Normal range of motion. Neck supple.  Cardiovascular: Normal rate and regular rhythm.   Pulmonary/Chest: Effort normal and breath sounds normal.  Abdominal: Soft. Bowel sounds are normal.  Genitourinary: Rectum normal and prostate normal.  Musculoskeletal: Normal range of motion.  Neurological: He is alert and oriented to person, place, and time.  Skin: Skin is warm and dry.  Psychiatric: He has a normal mood and affect.  His behavior is normal.          Assessment & Plan:   Patient presents for yearly preventative medicine examination.   all immunizations and health maintenance protocols were reviewed with the patient and they are up to date with these protocols.   screening laboratory values were reviewed with the patient including screening of hyperlipidemia PSA renal function and hepatic function.   There medications past medical history social history problem list and allergies were reviewed in detail.   Goals were established with regard to weight loss exercise diet in compliance with medications

## 2013-03-21 NOTE — Progress Notes (Signed)
Pre-visit discussion using our clinic review tool. No additional management support is needed unless otherwise documented below in the visit note.  

## 2013-03-21 NOTE — Patient Instructions (Signed)
Take the crestor once a week

## 2013-07-18 ENCOUNTER — Telehealth: Payer: Self-pay | Admitting: Internal Medicine

## 2013-07-18 MED ORDER — COLCHICINE 0.6 MG PO TABS: ORAL_TABLET | ORAL | Status: DC

## 2013-07-18 NOTE — Telephone Encounter (Signed)
rx sent in electronically 

## 2013-07-18 NOTE — Telephone Encounter (Signed)
[facility] - [place], [place] - [address]. Is requesting re-fill of COLCRYS 0.6 MG tablet

## 2013-09-03 ENCOUNTER — Other Ambulatory Visit: Payer: Self-pay | Admitting: Internal Medicine

## 2013-09-10 ENCOUNTER — Other Ambulatory Visit (INDEPENDENT_AMBULATORY_CARE_PROVIDER_SITE_OTHER): Payer: 59

## 2013-09-10 DIAGNOSIS — E785 Hyperlipidemia, unspecified: Secondary | ICD-10-CM

## 2013-09-10 LAB — LIPID PANEL
CHOL/HDL RATIO: 3
Cholesterol: 203 mg/dL — ABNORMAL HIGH (ref 0–200)
HDL: 75.2 mg/dL (ref >39.00)
LDL Cholesterol: 103 mg/dL — ABNORMAL HIGH (ref 0–99)
TRIGLYCERIDES: 126 mg/dL (ref 0.0–149.0)
VLDL: 25.2 mg/dL (ref 0.0–40.0)

## 2013-09-10 LAB — HEPATIC FUNCTION PANEL
ALT: 31 U/L (ref 0–53)
AST: 29 U/L (ref 0–37)
Albumin: 4.1 g/dL (ref 3.5–5.2)
Alkaline Phosphatase: 44 U/L (ref 39–117)
BILIRUBIN DIRECT: 0 mg/dL (ref 0.0–0.3)
BILIRUBIN TOTAL: 0.9 mg/dL (ref 0.3–1.2)
TOTAL PROTEIN: 7.5 g/dL (ref 6.0–8.3)

## 2013-09-17 ENCOUNTER — Encounter: Payer: Self-pay | Admitting: Internal Medicine

## 2013-09-17 ENCOUNTER — Ambulatory Visit (INDEPENDENT_AMBULATORY_CARE_PROVIDER_SITE_OTHER): Payer: 59 | Admitting: Internal Medicine

## 2013-09-17 VITALS — BP 116/74 | HR 80 | Temp 98.4°F | Ht 70.0 in | Wt 177.0 lb

## 2013-09-17 DIAGNOSIS — I10 Essential (primary) hypertension: Secondary | ICD-10-CM

## 2013-09-17 MED ORDER — OLMESARTAN MEDOXOMIL 20 MG PO TABS: 20.0000 mg | ORAL_TABLET | Freq: Every day | ORAL | Status: DC

## 2013-09-17 NOTE — Progress Notes (Signed)
The patient is instructed to continue all medications as prescribed. Schedule followup with check out clerk upon leaving the clinic  

## 2013-09-17 NOTE — Progress Notes (Signed)
   Subjective:    Patient ID: Angel Ellis, male    DOB: 1955-06-01, 58 y.o.   MRN: [id number]  HPI Follow up for HTM amd lipid therapy   Review of Systems  Constitutional: Negative for fever and fatigue.  HENT: Negative for congestion, hearing loss and postnasal drip.   Eyes: Negative for discharge, redness and visual disturbance.  Respiratory: Negative for cough, shortness of breath and wheezing.   Cardiovascular: Negative for leg swelling.  Gastrointestinal: Negative for abdominal pain, constipation and abdominal distention.  Genitourinary: Negative for urgency and frequency.  Musculoskeletal: Negative for arthralgias, joint swelling and neck pain.  Skin: Negative for color change and rash.  Neurological: Negative for weakness and light-headedness.  Hematological: Negative for adenopathy.  Psychiatric/Behavioral: Negative for behavioral problems.       Past Medical History  Diagnosis Date  . GERD (gastroesophageal reflux disease)   . Diverticulosis   . Hypertension   . Nasal congestion   . Lipoma of neck     History   Social History  . Marital Status: Single    Spouse Name: N/A    Number of Children: N/A  . Years of Education: N/A   Occupational History  . Not on file.   Social History Main Topics  . Smoking status: Former Smoker    Quit date: 12/08/2009  . Smokeless tobacco: Never Used  . Alcohol Use: Yes     Comment: one per day  . Drug Use: No  . Sexual Activity: Not on file   Other Topics Concern  . Not on file   Social History Narrative  . No narrative on file    No past surgical history on file.  Family History  Problem Relation Age of Onset  . Heart disease Mother   . Heart disease Father     Allergies  Allergen Reactions  . Aspirin Other (See Comments)    Abdominal cramping, gi discomfort  . Penicillins Palpitations    Makes heart race    Current Outpatient Prescriptions on File Prior to Visit  Medication Sig Dispense Refill  .  BENICAR 20 MG tablet TAKE 1 TABLET BY MOUTH ONCE DAILY  90 tablet  3  . colchicine (COLCRYS) 0.6 MG tablet take 1 tablet by mouth every 1 to 2 hours UNTIL ONE OF THE FOLLOWING OCCURS.1.THE PAIN IS GONE.OR 2.THE MAXIMUM DOSE GIVEN IS NO MORE THAN 3  10 tablet  5  . Misc Natural Products (SAW PALMETTO PLUS PO) Take by mouth daily.      . Multiple Vitamins-Minerals (MULTIVITAMIN PO) Take by mouth daily.      Marland Kitchen omeprazole (PRILOSEC) 20 MG capsule TAKE 1 CAPSULE BY MOUTH DAILY.  30 capsule  11  . rosuvastatin (CRESTOR) 20 MG tablet Take 1 tablet (20 mg total) by mouth once a week.  90 tablet  3   No current facility-administered medications on file prior to visit.    BP 116/74  Pulse 80  Temp(Src) 98.4 F (36.9 C) (Oral)  Ht 5\' 10"  (1.778 m)  Wt 177 lb (80.287 kg)  BMI 25.40 kg/m2    Objective:   Physical Exam  Cardiovascular: Normal rate and regular rhythm.   Pulmonary/Chest: Effort normal and breath sounds normal.  Abdominal: Soft. Bowel sounds are normal.          Assessment & Plan:  Stable HTN and lipid

## 2013-09-17 NOTE — Progress Notes (Signed)
Pre visit review using our clinic review tool, if applicable. No additional management support is needed unless otherwise documented below in the visit note. 

## 2013-09-17 NOTE — Patient Instructions (Signed)
The patient is instructed to continue all medications as prescribed. Schedule followup with check out clerk upon leaving the clinic  

## 2013-09-18 ENCOUNTER — Telehealth: Payer: Self-pay | Admitting: Internal Medicine

## 2013-09-18 NOTE — Telephone Encounter (Signed)
Relevant patient education assigned to patient using Emmi. ° °

## 2014-02-14 ENCOUNTER — Encounter: Payer: Self-pay | Admitting: Family Medicine

## 2014-02-14 ENCOUNTER — Ambulatory Visit (INDEPENDENT_AMBULATORY_CARE_PROVIDER_SITE_OTHER): Payer: 59 | Admitting: Family Medicine

## 2014-02-14 VITALS — BP 106/56 | Temp 98.5°F | Wt 177.4 lb

## 2014-02-14 DIAGNOSIS — R009 Unspecified abnormalities of heart beat: Secondary | ICD-10-CM

## 2014-02-14 DIAGNOSIS — R002 Palpitations: Secondary | ICD-10-CM

## 2014-02-14 NOTE — Patient Instructions (Signed)
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 These are benign.,,,,,,,,,,, colonic he complete caffeine free diet and if after a week or 2 you don't see any improvement then we'll consider medication.  Return when necessary

## 2014-02-14 NOTE — Progress Notes (Signed)
   Subjective:    Patient ID: Angel Ellis, male    DOB: [birth year], 58 y.o.   MRN: [id number]  HPI Angel Ellis is a 58 year old male who comes in today for evaluation of palpitations  He says for the last couple months she's been having episodes it recurred once a day and may last for an hour or 2 he feels like his chest is twitching. He is not checked his pulse. He thinks it may be related to caffeine so he went off caffeine completely however the sensation is still occurring. It's not associated with exertion and he has no pain or shortness of breath.   Review of Systems Review of systems otherwise negative    Objective:   Physical Exam  Well-developed well-nourished male in no acute distress vital signs stable is afebrile cardiopulmonary exam normal  When I went back to see him he says it's happening again and is checking his pulse this time it is skipping. I checked his pulse was racing and skipping also. EKG pending      Assessment & Plan:

## 2014-02-14 NOTE — Progress Notes (Signed)
Pre visit review using our clinic review tool, if applicable. No additional management support is needed unless otherwise documented below in the visit note. 

## 2014-05-08 ENCOUNTER — Telehealth: Payer: Self-pay | Admitting: *Deleted

## 2014-05-08 NOTE — Telephone Encounter (Signed)
Pt walked in today for "heart flutters".  He stated that he is taking benicar 20 mg qod and has been on it for 2 years now.  BP yesterday was 131/81.  Over the past 4-6 weeks he has been experiencing irregular heart rate.  He denies SOB.  He works for [employer] and he wanted to come in tomorrow 05/09/14 to see if maybe the benicar should be changed.  He had seen Dr Sherren Mocha in the past and Dr Sherren Mocha told him to quit drinking caffeine and see if that would help.  Pt states he has cut out all caffeine and he is still experience the irregular heart rate.  He is not in any acute distress.  Appt scheduled for 1 pm on 05/09/14 with Dr Elease Hashimoto.  Pt aware to go to the ER if he has SOB.

## 2014-05-09 ENCOUNTER — Ambulatory Visit (INDEPENDENT_AMBULATORY_CARE_PROVIDER_SITE_OTHER): Payer: 59 | Admitting: Family Medicine

## 2014-05-09 ENCOUNTER — Encounter: Payer: Self-pay | Admitting: Family Medicine

## 2014-05-09 VITALS — BP 130/80 | HR 84 | Temp 98.4°F | Wt 177.0 lb

## 2014-05-09 DIAGNOSIS — I493 Ventricular premature depolarization: Secondary | ICD-10-CM

## 2014-05-09 DIAGNOSIS — R002 Palpitations: Secondary | ICD-10-CM

## 2014-05-09 NOTE — Progress Notes (Signed)
   Subjective:    Patient ID: Angel Ellis, male    DOB: [birth year], 58 y.o.   MRN: [id number]  HPI Patient seen with history of palpitations. These have been going on for several months and he was seen couple months ago. 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 At that point, he reduced his caffeine and symptoms have improved but not resolved. Patient is fairly convinced that Benicar is a contributing factor (to his PVCs). He has noted that within an hour after taking this medication he tends to have more prominent symptoms. He has lost significant weight this year due to his efforts in his blood pressure stable and he actually recently reduced the Benicar to every other day. He has occasional mild lightheadedness but no history of syncope. No chest pains. No dyspnea. No exertional symptoms.  Patient had echocardiogram 2006 this was reviewed with no valvular abnormalities. Previous thyroid functions been normal. Patient is ex-smoker. No cardiac history. He does not take any decongestants or other medications that would likely trigger PVCs.  Past Medical History  Diagnosis Date  . GERD (gastroesophageal reflux disease)   . Diverticulosis   . Hypertension   . Nasal congestion   . Lipoma of neck    No past surgical history on file.  reports that he quit smoking about 4 years ago. He has never used smokeless tobacco. He reports that he drinks alcohol. He reports that he does not use illicit drugs. family history includes Heart disease in his father and mother. Allergies  Allergen Reactions  . Aspirin Other (See Comments)    Abdominal cramping, gi discomfort  . Penicillins Palpitations    Makes heart race      Review of Systems  Constitutional: Negative for fatigue.  Eyes: Negative for visual disturbance.  Respiratory: Negative for cough, chest tightness and shortness of breath.   Cardiovascular: Positive for palpitations. Negative for chest pain and leg swelling.    Neurological: Negative for dizziness, syncope, weakness, light-headedness and headaches.       Objective:   Physical Exam  Constitutional: He is oriented to person, place, and time. He appears well-developed and well-nourished.  HENT:  Right Ear: External ear normal.  Left Ear: External ear normal.  Mouth/Throat: Oropharynx is clear and moist.  Eyes: Pupils are equal, round, and reactive to light.  Neck: Neck supple. No thyromegaly present.  Cardiovascular: Normal rate and regular rhythm.   Pulmonary/Chest: Effort normal and breath sounds normal. No respiratory distress. He has no wheezes. He has no rales.  Musculoskeletal: He exhibits no edema.  Neurological: He is alert and oriented to person, place, and time.          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 These have improved symptomatically with reduction of caffeine. We explained that medications such as Benicar would not be a frequent triggering factor. Nevertheless, his blood pressure has improved with weight loss and he would like to try discontinuing Benicar to see if this makes a difference. He is currently only taking every other day with well controlled blood pressure today. He'll try leaving this off. Continue to avoid caffeine. If he has persistent symptoms off Benicar we recommended considering event monitor.

## 2014-05-09 NOTE — Progress Notes (Signed)
Pre visit review using our clinic review tool, if applicable. No additional management support is needed unless otherwise documented below in the visit note. 

## 2014-05-09 NOTE — Patient Instructions (Signed)
Premature Ventricular Contraction Premature ventricular contraction (PVC) is an irregularity of the heart rhythm involving extra or skipped heartbeats. In some cases, they may occur without obvious cause or heart disease. Other times, they can be caused by an electrolyte change in the blood. These need to be corrected. They can also be seen when there is not enough oxygen going to the heart. A common cause of this is plaque or cholesterol buildup. This buildup decreases the blood supply to the heart. In addition, extra beats may be caused or aggravated by:  Excessive smoking.  Alcohol consumption.  Caffeine.  Certain medications  Some street drugs. SYMPTOMS   The sensation of feeling your heart skipping a beat (palpitations).  In many cases, the person may have no symptoms. SIGNS AND TESTS   A physical examination may show an occasional irregularity, but if the PVC beats do not happen often, they may not be found on physical exam.  Blood pressure is usually normal.  Other tests that may find extra beats of the heart are:  An EKG (electrocardiogram)  A Holter monitor which can monitor your heart over longer periods of time  An Angiogram (study of the heart arteries). TREATMENT  Usually extra heartbeats do not need treatment. The condition is treated only if symptoms are severe or if extra beats are very frequent or are causing problems. An underlying cause, if discovered, may also require treatment.  Treatment may also be needed if there may be a risk for other more serious cardiac arrhythmias.  PREVENTION   Moderation in caffeine, alcohol, and tobacco use may reduce the risk of ectopic heartbeats in some people.  Exercise often helps people who lead a sedentary (inactive) lifestyle. PROGNOSIS  PVC heartbeats are generally harmless and do not need treatment.  RISKS AND COMPLICATIONS   Ventricular tachycardia (occasionally).  There usually are no complications.  Other  arrhythmias (occasionally). SEEK IMMEDIATE MEDICAL CARE IF:   You feel palpitations that are frequent or continual.  You develop chest pain or other problems such as shortness of breath, sweating, or nausea and vomiting.  You become light-headed or faint (pass out).  You get worse or do not improve with treatment. Document Released: 12/12/2003 Document Revised: 07/19/2011 Document Reviewed: 06/23/2007 [facility] Patient Information 2015 [place], [place]. This information is not intended to replace advice given to you by your health care provider. Make sure you discuss any questions you have with your health care provider.   Try leaving off the Benicar Monitor blood pressure and be in touch if consistently > 140/90 We can consider event monitor if symptoms persist off Benicar.

## 2014-07-23 ENCOUNTER — Encounter: Payer: Self-pay | Admitting: Family Medicine

## 2014-07-23 ENCOUNTER — Ambulatory Visit (INDEPENDENT_AMBULATORY_CARE_PROVIDER_SITE_OTHER): Payer: 59 | Admitting: Family Medicine

## 2014-07-23 VITALS — BP 120/72 | HR 86 | Temp 98.8°F | Wt 178.0 lb

## 2014-07-23 DIAGNOSIS — M109 Gout, unspecified: Secondary | ICD-10-CM | POA: Insufficient documentation

## 2014-07-23 DIAGNOSIS — J309 Allergic rhinitis, unspecified: Secondary | ICD-10-CM | POA: Insufficient documentation

## 2014-07-23 DIAGNOSIS — I493 Ventricular premature depolarization: Secondary | ICD-10-CM

## 2014-07-23 DIAGNOSIS — I10 Essential (primary) hypertension: Secondary | ICD-10-CM

## 2014-07-23 DIAGNOSIS — L719 Rosacea, unspecified: Secondary | ICD-10-CM | POA: Insufficient documentation

## 2014-07-23 DIAGNOSIS — E785 Hyperlipidemia, unspecified: Secondary | ICD-10-CM

## 2014-07-23 DIAGNOSIS — Z1211 Encounter for screening for malignant neoplasm of colon: Secondary | ICD-10-CM

## 2014-07-23 DIAGNOSIS — M1009 Idiopathic gout, multiple sites: Secondary | ICD-10-CM

## 2014-07-23 NOTE — Progress Notes (Signed)
Angel Reddish, MD Phone: [phone]  Subjective:  Patient presents today to establish care with me as their new primary care provider. Patient was formerly a patient of Angel Ellis. Chief complaint-noted.   Hyperlipidemia-very mild poor control on crestor once a week.   Lab Results  Component Value Date   LDLCALC 103* 09/10/2013  Regular exercise: minimal during winter, hopeful for improved habits this spring/summer Diet: some poor choice ROS- no chest pain or shortness of breath. No myalgias  Gout- controlled -uses colchicine prn. Uric acid slightly high ROS- no recent hot/swollen joints  Hypertension-controlled on benicar 20mg  every other day Frequent PVCs- complete resolution of palpitations on every other day benicar BP Readings from Last 3 Encounters:  07/23/14 120/72  05/09/14 130/80  02/14/14 106/56   Home BP monitoring-yes and even on off days BP never >140/90 Compliant with medications-yes without side effects ROS-Denies any CP, HA, SOB, blurry vision, LE edema   The following were reviewed and entered/updated in epic: Past Medical History  Diagnosis Date  . GERD (gastroesophageal reflux disease)   . Diverticulosis   . Hypertension   . Nasal congestion   . Lipoma of neck     L neck-remains in place  . Diverticulitis of colon 10/29/2009    After august 1995    . Sebaceous cyst 01/07/2009    R axilla     Patient Active Problem List   Diagnosis Date Noted  . Gout 07/23/2014    Priority: Medium  . Essential hypertension 07/23/2014    Priority: Medium  . 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 Former smoker 06/29/2007    Priority: Medium  . Hyperlipidemia 02/15/2007    Priority: Medium  . Allergic rhinitis 07/23/2014    Priority: Low  . Rosacea 07/23/2014    Priority: Low  . Basal cell carcinoma of skin 01/07/2009    Priority: Low  . GERD 02/15/2007    Priority: Low   Past Surgical History  Procedure Laterality Date  . None       Family History  Problem Relation Age of Onset  . Heart disease Mother     stopped psychiatric meds and BP meds, later with MI  . Heart disease Father     sleep apnea not controlled. 74.   . Hypertension Mother   . Schizophrenia Mother     Medications- reviewed and updated Current Outpatient Prescriptions  Medication Sig Dispense Refill  . colchicine (COLCRYS) 0.6 MG tablet take 1 tablet by mouth every 1 to 2 hours UNTIL ONE OF THE FOLLOWING OCCURS.1.THE PAIN IS GONE.OR 2.THE MAXIMUM DOSE GIVEN IS NO MORE THAN 3 (Patient not taking: Reported on 07/23/2014) 10 tablet 5  . diphenhydrAMINE (BENADRYL) 25 mg capsule Take 25 mg by mouth every 6 (six) hours as needed.    . Multiple Vitamins-Minerals (ZINC PO) Take by mouth.    . olmesartan (BENICAR) 20 MG tablet Take 1 tablet (20 mg total) by mouth daily. (Patient not taking: Reported on 07/23/2014) 30 tablet 11  . omeprazole (PRILOSEC) 20 MG capsule TAKE 1 CAPSULE BY MOUTH DAILY. (Patient not taking: Reported on 07/23/2014) 30 capsule 11  . rosuvastatin (CRESTOR) 20 MG tablet Take 1 tablet (20 mg total) by mouth once a week. 90 tablet 3   No current facility-administered medications for this visit.    Allergies-reviewed and updated Allergies  Allergen Reactions  . Aspirin Other (See Comments)    Abdominal cramping, gi discomfort  . Penicillins Palpitations  Makes heart race    History   Social History  . Marital Status: Single    Spouse Name: N/A  . Number of Children: N/A  . Years of Education: N/A   Social History Main Topics  . Smoking status: Former Smoker -- 1.00 packs/day for 37 years    Types: Cigarettes    Quit date: 12/08/2009  . Smokeless tobacco: Never Used  . Alcohol Use: 8.4 oz/week    14 Standard drinks or equivalent per week     Comment: one per day  . Drug Use: No  . Sexual Activity: No   Other Topics Concern  . None   Social History Narrative   Domestic partner, not sexually active Angel Mark  Ellis also a patient here). No kids. Shares home with a friend-bought together.       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     ROS--See HPI   Objective: BP 120/72 mmHg  Pulse 86  Temp(Src) 98.8 F (37.1 C)  Wt 178 lb (80.74 kg) Gen: NAD, resting comfortably CV: RRR no murmurs rubs or gallops Lungs: CTAB no crackles, wheeze, rhonchi Abdomen: soft/nontender/nondistended/normal bowel sounds.  Ext: no edema, 2+ PT and radial pulses Skin: warm, dry, erythematous papules on scalp (treated by derm)  Neuro: grossly normal, moves all extremities, normal gait   Assessment/Plan:  Hyperlipidemia Mild poor control on Crestor 20mg  once a week. We discussed increasing diet/exercise which should help with obesity as well.    Gout Controlled on Colchicine prn. Last use late 2015. Agreed if more than once a year or if more than 1 pill needed (current need to start flare), consider allopurinol given last uric acid 8.6    Essential hypertension Odd regimen on benicar 20mg  every other day. Checks BP at work and never over 140/90 even on off days. Discussed 10mg  daily but patient has had drastic reduction on palpitations on current regimen and wants to continue.    Frequent PVCs Much improved (essentially resolved). See hypertension   6-12 months for CPE

## 2014-07-23 NOTE — Patient Instructions (Addendum)
6-12 month follow up for CPE/physical exam  No other changes

## 2014-07-24 NOTE — Assessment & Plan Note (Signed)
Controlled on Colchicine prn. Last use late 2015. Agreed if more than once a year or if more than 1 pill needed (current need to start flare), consider allopurinol given last uric acid 8.6

## 2014-07-24 NOTE — Assessment & Plan Note (Signed)
Mild poor control on Crestor 20mg  once a week. We discussed increasing diet/exercise which should help with obesity as well.

## 2014-07-24 NOTE — Assessment & Plan Note (Signed)
Much improved (essentially resolved). See hypertension

## 2014-07-24 NOTE — Assessment & Plan Note (Signed)
Odd regimen on benicar 20mg  every other day. Checks BP at work and never over 140/90 even on off days. Discussed 10mg  daily but patient has had drastic reduction on palpitations on current regimen and wants to continue.

## 2014-08-27 ENCOUNTER — Telehealth: Payer: Self-pay

## 2014-08-27 MED ORDER — OMEPRAZOLE 20 MG PO CPDR: 20.0000 mg | DELAYED_RELEASE_CAPSULE | Freq: Every day | ORAL | Status: DC

## 2014-08-27 NOTE — Telephone Encounter (Signed)
[facility] refill request for omeprazole (PRILOSEC) 20 MG capsule

## 2014-08-27 NOTE — Telephone Encounter (Signed)
Medication refilled

## 2014-09-23 ENCOUNTER — Ambulatory Visit (INDEPENDENT_AMBULATORY_CARE_PROVIDER_SITE_OTHER): Payer: 59 | Admitting: Family Medicine

## 2014-09-23 ENCOUNTER — Encounter: Payer: Self-pay | Admitting: Family Medicine

## 2014-09-23 VITALS — BP 130/82 | HR 74 | Temp 98.6°F | Wt 176.0 lb

## 2014-09-23 DIAGNOSIS — J069 Acute upper respiratory infection, unspecified: Secondary | ICD-10-CM | POA: Diagnosis not present

## 2014-09-23 MED ORDER — ROSUVASTATIN CALCIUM 20 MG PO TABS: 20.0000 mg | ORAL_TABLET | ORAL | Status: DC

## 2014-09-23 NOTE — Patient Instructions (Signed)
Upper Respiratory Infection The most important thing is to get a lot of rest and stay well hydrated.  If you develop fevers, worsening of symptoms beyond Thursday, worsening of symptoms after they get better, fever, shortness of breath please schedule a follow up visit or give [place] a call for advise. Should start turning corner by Wednesday and be at least 75% better by next monday  Call me Monday if not doing better and give [place] a report on sinus pressure level-may need to call in antibiotic at that point  For fever or pain-use tylenol or ibuprofen For congestion-continue  neti pot

## 2014-09-23 NOTE — Progress Notes (Signed)
  PCP: Garret Reddish, MD  Subjective:  Angel Ellis is a 59 y.o. year old very pleasant male patient who presents with Upper Respiratory infection. Started Friday afternoon with sinus irritation, drainage. Congestion, productive of yellow or clear sputum. Phenylephrine nasal spray has helped. Very fatigued, run down. Sore throat. Continued to worsen through today. Day 4 of illness. Out of work today and plan to be out tomorrow as well. Some sinus pressure. Works in hospital so several sick contacts  ROS-denies subjective warmth and chills no measured fever, no SOB, NVD, tooth pain.   Pertinent Past Medical History- HLD, former smoker, Gout, HTN  Medications- reviewed  Current Outpatient Prescriptions  Medication Sig Dispense Refill  . colchicine (COLCRYS) 0.6 MG tablet take 1 tablet by mouth every 1 to 2 hours UNTIL ONE OF THE FOLLOWING OCCURS.1.THE PAIN IS GONE.OR 2.THE MAXIMUM DOSE GIVEN IS NO MORE THAN 3 10 tablet 5  . Multiple Vitamins-Minerals (ZINC PO) Take by mouth.    Marland Kitchen omeprazole (PRILOSEC) 20 MG capsule Take 1 capsule (20 mg total) by mouth daily. 30 capsule 5  . rosuvastatin (CRESTOR) 20 MG tablet Take 1 tablet (20 mg total) by mouth once a week. 90 tablet 3  . diphenhydrAMINE (BENADRYL) 25 mg capsule Take 25 mg by mouth every 6 (six) hours as needed.    Marland Kitchen olmesartan (BENICAR) 20 MG tablet Take 1 tablet (20 mg total) by mouth daily. (Patient not taking: Reported on 09/23/2014) 30 tablet 11   Objective: BP 130/82 mmHg  Pulse 74  Temp(Src) 98.6 F (37 C)  Wt 176 lb (79.833 kg) Gen: NAD, resting comfortably HEENT: Turbinates erythematous with clear drainage, TM normal, pharynx mildly erythematous with no tonsilar exudate or edema, slight sinus tenderness CV: RRR no murmurs rubs or gallops Lungs: CTAB no crackles, wheeze, rhonchi Abdomen: soft/nontender/nondistended/normal bowel sounds.  Ext: no edema Skin: warm, dry, no rash   Assessment/Plan:  Upper Respiratory  infection See AVS. Symptomatic care only.   History and exam today are suggestive of viral URI.  We discussed that a serious infection or illness is unlikely. We also discussed other potential etiologies none suggestive of bacterial infection at this time. We discussed treatment side effects, likely course, antibiotic misuse, transmission, and signs of developing a serious illness.  Finally, we reviewed reasons to return to care including if symptoms worsen or persist or new concerns arise. As above, see avs

## 2014-10-15 ENCOUNTER — Telehealth: Payer: Self-pay | Admitting: Family Medicine

## 2014-10-15 MED ORDER — COLCHICINE 0.6 MG PO TABS: ORAL_TABLET | ORAL | Status: DC

## 2014-10-15 NOTE — Telephone Encounter (Signed)
Patient  Is needing refill of medication colchicine 0.6 mg he Is here in the lobby, please advise

## 2014-10-15 NOTE — Telephone Encounter (Signed)
Refill has been sent to [facility]

## 2014-11-10 ENCOUNTER — Emergency Department (HOSPITAL_COMMUNITY): Payer: 59

## 2014-11-10 ENCOUNTER — Encounter (HOSPITAL_COMMUNITY): Payer: Self-pay | Admitting: Emergency Medicine

## 2014-11-10 ENCOUNTER — Emergency Department (HOSPITAL_COMMUNITY)
Admission: EM | Admit: 2014-11-10 | Discharge: 2014-11-10 | Disposition: A | Discharge status: Home / Self Care | Payer: 59 | Attending: Emergency Medicine | Admitting: Emergency Medicine

## 2014-11-10 DIAGNOSIS — W1839XA Other fall on same level, initial encounter: Secondary | ICD-10-CM | POA: Insufficient documentation

## 2014-11-10 DIAGNOSIS — K219 Gastro-esophageal reflux disease without esophagitis: Secondary | ICD-10-CM | POA: Diagnosis not present

## 2014-11-10 DIAGNOSIS — Y999 Unspecified external cause status: Secondary | ICD-10-CM | POA: Diagnosis not present

## 2014-11-10 DIAGNOSIS — Z86018 Personal history of other benign neoplasm: Secondary | ICD-10-CM | POA: Insufficient documentation

## 2014-11-10 DIAGNOSIS — M25572 Pain in left ankle and joints of left foot: Secondary | ICD-10-CM

## 2014-11-10 DIAGNOSIS — I10 Essential (primary) hypertension: Secondary | ICD-10-CM | POA: Diagnosis not present

## 2014-11-10 DIAGNOSIS — Y929 Unspecified place or not applicable: Secondary | ICD-10-CM | POA: Insufficient documentation

## 2014-11-10 DIAGNOSIS — Y939 Activity, unspecified: Secondary | ICD-10-CM | POA: Diagnosis not present

## 2014-11-10 DIAGNOSIS — Z87891 Personal history of nicotine dependence: Secondary | ICD-10-CM | POA: Diagnosis not present

## 2014-11-10 DIAGNOSIS — Z79899 Other long term (current) drug therapy: Secondary | ICD-10-CM | POA: Diagnosis not present

## 2014-11-10 DIAGNOSIS — S99912A Unspecified injury of left ankle, initial encounter: Secondary | ICD-10-CM | POA: Diagnosis not present

## 2014-11-10 DIAGNOSIS — Z872 Personal history of diseases of the skin and subcutaneous tissue: Secondary | ICD-10-CM | POA: Insufficient documentation

## 2014-11-10 MED ORDER — ALPRAZOLAM 0.5 MG PO TABS: 0.5000 mg | ORAL_TABLET | Freq: Every evening | ORAL | Status: DC | PRN

## 2014-11-10 NOTE — ED Notes (Signed)
Awake. Verbally responsive. A/O x4. Resp even and unlabored. No audible adventitious breath sounds noted. ABC's intact.  

## 2014-11-10 NOTE — ED Notes (Signed)
Pt reported tripping and falling causing lt foot pain. Noted swelling. (+)PMS, CRT brisk, LROM, partial weight bearing, no deformity noted.

## 2014-11-10 NOTE — ED Notes (Signed)
Pt taken to x-ray and returned to room without distress noted. 

## 2014-11-10 NOTE — ED Provider Notes (Signed)
CSN: [id number]     Arrival date & time 11/10/14  0618 History   First MD Initiated Contact with Patient 11/10/14 [phone]     Chief Complaint  Patient presents with  . Fall  . Ankle Pain    left     (Consider location/radiation/quality/duration/timing/severity/associated sxs/prior Treatment) HPI   Pt presents with left ankle pain and swelling after accidentally missing a step while going to work two days ago and rolling ankle.  Reports inverting his ankle when he fell, scraped his left knee but denies other injury.  Pain is 10/10 and aching with weight bearing.  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  Has tried tylenol at home without improvement.    Past Medical History  Diagnosis Date  . GERD (gastroesophageal reflux disease)   . Diverticulosis   . Hypertension   . Nasal congestion   . Lipoma of neck     L neck-remains in place  . Diverticulitis of colon 10/29/2009    After august 1995    . Sebaceous cyst 01/07/2009    R axilla     Past Surgical History  Procedure Laterality Date  . None     Family History  Problem Relation Age of Onset  . Heart disease Mother     stopped psychiatric meds and BP meds, later with MI  . Heart disease Father     sleep apnea not controlled. 74.   . Hypertension Mother   . Schizophrenia Mother    History  Substance Use Topics  . Smoking status: Former Smoker -- 1.00 packs/day for 37 years    Types: Cigarettes    Quit date: 12/08/2009  . Smokeless tobacco: Never Used  . Alcohol Use: 8.4 oz/week    14 Standard drinks or equivalent per week     Comment: one per day    Review of Systems  Constitutional: Negative for fever and chills.  Cardiovascular: Negative for leg swelling.  Musculoskeletal: Positive for arthralgias.  Skin: Positive for color change. Negative for wound.  Allergic/Immunologic: Negative for immunocompromised state.  Neurological: Negative for weakness and numbness.  Psychiatric/Behavioral: Negative for  self-injury.      Allergies  Aspirin and Penicillins  Home Medications   Prior to Admission medications   Medication Sig Start Date End Date Taking? Authorizing Provider  acetaminophen (TYLENOL) 325 MG tablet Take 975 mg by mouth every 6 (six) hours as needed for moderate pain.   Yes Historical Provider, MD  colchicine (COLCRYS) 0.6 MG tablet take 1 tablet by mouth every 1 to 2 hours UNTIL ONE OF THE FOLLOWING OCCURS.1.THE PAIN IS GONE.OR 2.THE MAXIMUM DOSE GIVEN IS NO MORE THAN 3 Patient taking differently: Take 0.6 mg by mouth daily as needed (gout). take 1 tablet by mouth every 1 to 2 hours UNTIL ONE OF THE FOLLOWING OCCURS.1.THE PAIN IS GONE.OR 2.THE MAXIMUM DOSE GIVEN IS NO MORE THAN 3 10/15/14  Yes Marin Olp, MD  hydroxypropyl methylcellulose / hypromellose (ISOPTO TEARS / GONIOVISC) 2.5 % ophthalmic solution Place 1 drop into both eyes 3 (three) times daily as needed for dry eyes.   Yes Historical Provider, MD  olmesartan (BENICAR) 20 MG tablet Take 1 tablet (20 mg total) by mouth daily. Patient taking differently: Take 20 mg by mouth 2 (two) times a week. Tuesday and thursday 09/17/13  Yes Ricard Dillon, MD  omeprazole (PRILOSEC) 20 MG capsule Take 1 capsule (20 mg total) by mouth daily. 08/27/14  Yes Marin Olp,  MD  oxymetazoline (AFRIN) 0.05 % nasal spray Place 1 spray into both nostrils 2 (two) times daily as needed for congestion.   Yes Historical Provider, MD  rosuvastatin (CRESTOR) 20 MG tablet Take 1 tablet (20 mg total) by mouth once a week. Patient taking differently: Take 20 mg by mouth once a week. Wed 09/23/14  Yes Marin Olp, MD  ALPRAZolam Duanne Moron) 0.5 MG tablet Take 1 tablet (0.5 mg total) by mouth at bedtime as needed for sleep. 11/10/14   Clayton Bibles, PA-C   BP 143/81 mmHg  Pulse 79  Temp(Src) 97.8 F (36.6 C) (Oral)  Resp 18  SpO2 100% Physical Exam  Constitutional: He appears well-developed and well-nourished. No distress.  HENT:  Head:  Normocephalic and atraumatic.  Neck: Neck supple.  Pulmonary/Chest: Effort normal.  Musculoskeletal:       Left ankle: He exhibits swelling. He exhibits no ecchymosis, no deformity, no laceration and normal pulse. Tenderness.       Left lower leg: Normal.       Left foot: There is normal range of motion, normal capillary refill and no deformity.       Feet:  Left lateral ankle with edema, mild erythema, tenderness.  Pulses intact, sensation intact, pt able to move toes.  Compartments are soft.    Neurological: He is alert.  Skin: He is not diaphoretic.  Nursing note and vitals reviewed.   ED Course  Procedures (including critical care time) Labs Review Labs Reviewed - No data to display  Imaging Review Dg Ankle Complete Left  11/10/2014   CLINICAL DATA:  Proximal ankle pain, swelling. Inversion injury, fell down steps 2 nights ago.  EXAM: LEFT ANKLE COMPLETE - 3+ VIEW  COMPARISON:  None.  FINDINGS: Diffuse soft tissue swelling about the left ankle. Small plantar calcaneal spur. No fracture, subluxation or dislocation.  IMPRESSION: No acute bony abnormality.   Electronically Signed   By: Rolm Baptise M.D.   On: 11/10/2014 07:31   Dg Foot Complete Left  11/10/2014   CLINICAL DATA:  Foot pain across proximal metatarsals. Swelling. Inversion injury falling down stairs 2 nights ago  EXAM: LEFT FOOT - COMPLETE 3+ VIEW  COMPARISON:  None.  FINDINGS: Plantar calcaneal spur. No acute bony abnormality. Specifically, no fracture, subluxation, or dislocation. Soft tissues are intact.  IMPRESSION: No acute bony abnormality.   Electronically Signed   By: Rolm Baptise M.D.   On: 11/10/2014 07:32     EKG Interpretation None      MDM   Final diagnoses:  Left ankle pain    Afebrile, nontoxic patient with injury to his left ankle while missing step/inverting ankle.   Xray negative.  Neurovascularly intact.   D/C home with ASO, crutches, work note.  Pt declined pain medication but requested  something to help him sleep at night.   Discussed result, findings, treatment, and follow up  with patient.  Pt given return precautions.  Pt verbalizes understanding and agrees with plan.        Clayton Bibles, PA-C 11/10/14 [place], MD 11/10/14 [phone]

## 2014-11-10 NOTE — ED Notes (Signed)
Pt given crutch training with returned demonstration. Pt tolerated well.

## 2014-11-10 NOTE — Discharge Instructions (Signed)
Read the information below.  Use the prescribed medication as directed.  Please discuss all new medications with your pharmacist.  You may return to the Emergency Department at any time for worsening condition or any new symptoms that concern you.  If you develop uncontrolled pain, weakness or numbness of the extremity, severe discoloration of the skin, or you are unable to move your toes or walk, return to the ER for a recheck.      Ankle Pain Ankle pain is a common symptom. The bones, cartilage, tendons, and muscles of the ankle joint perform a lot of work each day. The ankle joint holds your body weight and allows you to move around. Ankle pain can occur on either side or back of 1 or both ankles. Ankle pain may be sharp and burning or dull and aching. There may be tenderness, stiffness, redness, or warmth around the ankle. The pain occurs more often when a person walks or puts pressure on the ankle. CAUSES  There are many reasons ankle pain can develop. It is important to work with your caregiver to identify the cause since many conditions can impact the bones, cartilage, muscles, and tendons. Causes for ankle pain include:  Injury, including a break (fracture), sprain, or strain often due to a fall, sports, or a high-impact activity.  Swelling (inflammation) of a tendon (tendonitis).  Achilles tendon rupture.  Ankle instability after repeated sprains and strains.  Poor foot alignment.  Pressure on a nerve (tarsal tunnel syndrome).  Arthritis in the ankle or the lining of the ankle.  Crystal formation in the ankle (gout or pseudogout). DIAGNOSIS  A diagnosis is based on your medical history, your symptoms, results of your physical exam, and results of diagnostic tests. Diagnostic tests may include X-ray exams or a computerized magnetic scan (magnetic resonance imaging, MRI). TREATMENT  Treatment will depend on the cause of your ankle pain and may include:  Keeping pressure off the  ankle and limiting activities.  Using crutches or other walking support (a cane or brace).  Using rest, ice, compression, and elevation.  Participating in physical therapy or home exercises.  Wearing shoe inserts or special shoes.  Losing weight.  Taking medications to reduce pain or swelling or receiving an injection.  Undergoing surgery. HOME CARE INSTRUCTIONS   Only take over-the-counter or prescription medicines for pain, discomfort, or fever as directed by your caregiver.  Put ice on the injured area.  Put ice in a plastic bag.  Place a towel between your skin and the bag.  Leave the ice on for 15-20 minutes at a time, 03-04 times a day.  Keep your leg raised (elevated) when possible to lessen swelling.  Avoid activities that cause ankle pain.  Follow specific exercises as directed by your caregiver.  Record how often you have ankle pain, the location of the pain, and what it feels like. This information may be helpful to you and your caregiver.  Ask your caregiver about returning to work or sports and whether you should drive.  Follow up with your caregiver for further examination, therapy, or testing as directed. SEEK MEDICAL CARE IF:   Pain or swelling continues or worsens beyond 1 week.  You have an oral temperature above 102 F (38.9 C).  You are feeling unwell or have chills.  You are having an increasingly difficult time with walking.  You have loss of sensation or other new symptoms.  You have questions or concerns. MAKE SURE YOU:   Understand  these instructions.  Will watch your condition.  Will get help right away if you are not doing well or get worse. Document Released: 10/14/2009 Document Revised: 07/19/2011 Document Reviewed: 10/14/2009 [facility] Patient Information 2015 [place], [place]. This information is not intended to replace advice given to you by your health care provider. Make sure you discuss any questions you have with your  health care provider.

## 2014-11-10 NOTE — ED Notes (Addendum)
Patient had a mechanical fall Friday. No pain at that time but continued to go to work and is now having left ankle pain. Pt says it is "unbearable to put weight on the left ankle." Denies back/neck/knee pain. No other c/c. Left ankle is grossly swollen.

## 2015-02-18 ENCOUNTER — Other Ambulatory Visit: Payer: Self-pay | Admitting: Family Medicine

## 2015-02-19 ENCOUNTER — Telehealth: Payer: Self-pay

## 2015-02-19 MED ORDER — ALPRAZOLAM 0.5 MG PO TABS: 0.5000 mg | ORAL_TABLET | Freq: Every evening | ORAL | Status: DC | PRN

## 2015-02-19 NOTE — Telephone Encounter (Signed)
Medication called in 

## 2015-02-19 NOTE — Telephone Encounter (Signed)
Refill ok? 

## 2015-02-19 NOTE — Telephone Encounter (Signed)
Refill request for alprazolam 0.5mg  #5, refill ok?

## 2015-05-22 DIAGNOSIS — H10503 Unspecified blepharoconjunctivitis, bilateral: Secondary | ICD-10-CM | POA: Diagnosis not present

## 2015-05-22 MED FILL — TOBRAMYCIN-DEXAMETH OPTH SU: 0.3-0.1 | 7 days supply | Qty: 5 | Fill #0

## 2015-05-23 MED FILL — OMEPRAZOLE DR 20 MG CAPSULE: 20 | 90 days supply | Qty: 90 | Fill #1

## 2015-05-23 MED FILL — ROSUVASTATIN CALCIUM 20 MG: 20 | 84 days supply | Qty: 12 | Fill #1

## 2015-06-05 ENCOUNTER — Ambulatory Visit (INDEPENDENT_AMBULATORY_CARE_PROVIDER_SITE_OTHER): Payer: 59 | Admitting: Family Medicine

## 2015-06-05 ENCOUNTER — Encounter: Payer: Self-pay | Admitting: Family Medicine

## 2015-06-05 VITALS — BP 140/90 | HR 84 | Temp 98.9°F | Wt 175.0 lb

## 2015-06-05 DIAGNOSIS — I493 Ventricular premature depolarization: Secondary | ICD-10-CM

## 2015-06-05 DIAGNOSIS — Z20828 Contact with and (suspected) exposure to other viral communicable diseases: Secondary | ICD-10-CM | POA: Diagnosis not present

## 2015-06-05 DIAGNOSIS — I10 Essential (primary) hypertension: Secondary | ICD-10-CM | POA: Diagnosis not present

## 2015-06-05 MED ORDER — NEBIVOLOL HCL 5 MG PO TABS: 5.0000 mg | ORAL_TABLET | Freq: Every day | ORAL | Status: DC

## 2015-06-05 MED FILL — BYSTOLIC 5 MG TABLET: 5 | 30 days supply | Qty: 30 | Fill #0

## 2015-06-05 NOTE — Patient Instructions (Signed)
Start nebivlol low dose at 5mg . This is a beta blocker which will slow your heart rate some. You may feel mildly fatigued at first- often gets better with time.   Goal is to be <140/90. If not consistently under this (at least 75% of time) then I can increase to 10mg .   Other option is hydrochlorothiazide if you want to look into thi  Schedule physical at your convenience within 4 months. Get labs - HIV and hepatitis c that I ordered today in addition to standard labs

## 2015-06-05 NOTE — Assessment & Plan Note (Signed)
PVCs S: poorly controlled with last dose of benicar on Monday. Patient thought PVCs due to benicar 20mg - he took every other day and reduced PVCs.. Never over 140/90 even on off days reportedly. Has been drinking more coffee lately and noticed more pvcs. Even though elevated at present reports 124/80 at home earlier today- when has checked at work has been high though BP Readings from Last 3 Encounters:  06/05/15 140/90  11/10/14 143/81  09/23/14 130/82  A/P: BP mildly elevated despite 3 lbsweight loss over last 8 months. Discussed likely not benicar causing PVCs. He is drinking more coffee and has noted increase in PVCs. We will transition to bisoprolol 5mg  and see if enough for BP control and also to see if will reduce PVCs. Advised caffeine reduction as discussed in past.  Follow up by phone call or mychart with consideration increasing dose or trying hctz.

## 2015-06-05 NOTE — Progress Notes (Signed)
Garret Reddish, MD  Subjective:  Angel Ellis is a 60 y.o. year old very pleasant male patient who presents for/with See problem oriented charting ROS- no chest pain or shortness of breath. No headache. No blurry vision.   Past Medical History-  Patient Active Problem List   Diagnosis Date Noted  . Gout 07/23/2014    Priority: Medium  . Essential hypertension 07/23/2014    Priority: Medium  . 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 Former smoker 06/29/2007    Priority: Medium  . Hyperlipidemia 02/15/2007    Priority: Medium  . Allergic rhinitis 07/23/2014    Priority: Low  . Rosacea 07/23/2014    Priority: Low  . Basal cell carcinoma of skin 01/07/2009    Priority: Low  . GERD 02/15/2007    Priority: Low    Medications- reviewed and updated Current Outpatient Prescriptions  Medication Sig Dispense Refill  . hydroxypropyl methylcellulose / hypromellose (ISOPTO TEARS / GONIOVISC) 2.5 % ophthalmic solution Place 1 drop into both eyes 3 (three) times daily as needed for dry eyes.    Marland Kitchen omeprazole (PRILOSEC) 20 MG capsule TAKE 1 CAPSULE BY MOUTH ONCE DAILY 30 capsule 5  . rosuvastatin (CRESTOR) 20 MG tablet Take 1 tablet (20 mg total) by mouth once a week. (Patient taking differently: Take 20 mg by mouth once a week. Wed) 30 tablet 1  . ALPRAZolam (XANAX) 0.5 MG tablet Take 1 tablet (0.5 mg total) by mouth at bedtime as needed for sleep. (Patient not taking: Reported on 06/05/2015) 5 tablet 4  . colchicine (COLCRYS) 0.6 MG tablet take 1 tablet by mouth every 1 to 2 hours UNTIL ONE OF THE FOLLOWING OCCURS.1.THE PAIN IS GONE.OR 2.THE MAXIMUM DOSE GIVEN IS NO MORE THAN 3 (Patient not taking: Reported on 06/05/2015) 10 tablet 5  . nebivolol (BYSTOLIC) 5 MG tablet Take 1 tablet (5 mg total) by mouth daily. 30 tablet 5   No current facility-administered medications for this visit.    Objective: BP 140/90 mmHg  Pulse 84  Temp(Src) 98.9 F (37.2 C)  Wt 175 lb (79.379 kg) Gen:  NAD, resting comfortably CV: RRR no murmurs rubs or gallops. No obvious PVCs Lungs: CTAB no crackles, wheeze, rhonchi Abdomen: soft/nontender/nondistended/normal bowel sounds. No rebound or guarding.  Ext: no edema Skin: warm, dry Neuro: grossly normal, moves all extremities, normal gait  Assessment/Plan:  Essential hypertension PVCs S: poorly controlled with last dose of benicar on Monday. Patient thought PVCs due to benicar 20mg - he took every other day and reduced PVCs.. Never over 140/90 even on off days reportedly. Has been drinking more coffee lately and noticed more pvcs. Even though elevated at present reports 124/80 at home earlier today- when has checked at work has been high though BP Readings from Last 3 Encounters:  06/05/15 140/90  11/10/14 143/81  09/23/14 130/82  A/P: BP mildly elevated despite 3 lbsweight loss over last 8 months. Discussed likely not benicar causing PVCs. He is drinking more coffee and has noted increase in PVCs. We will transition to bisoprolol 5mg  and see if enough for BP control and also to see if will reduce PVCs. Advised caffeine reduction as discussed in past.  Follow up by phone call or mychart with consideration increasing dose or trying hctz.   Follow up primarily to be by patient checking BP at work and reaching out with #s as well as how he is doing with medicine. Of course seeing within 6 months reasonable. Return precautions  advised.   Orders Placed This Encounter  Procedures  . Hepatitis C antibody, reflex    [facility]    Standing Status: Future     Number of Occurrences:      Standing Expiration Date: 06/04/2016  . HIV antibody    Standing Status: Future     Number of Occurrences:      Standing Expiration Date: 06/04/2016    Meds ordered this encounter  Medications  . nebivolol (BYSTOLIC) 5 MG tablet    Sig: Take 1 tablet (5 mg total) by mouth daily.    Dispense:  30 tablet    Refill:  5

## 2015-06-13 MED FILL — ALPRAZolam 0.5 MG TABS: 0.5 | 5 days supply | Qty: 5 | Fill #1

## 2015-06-17 ENCOUNTER — Encounter: Payer: Self-pay | Admitting: Internal Medicine

## 2015-06-17 ENCOUNTER — Ambulatory Visit (INDEPENDENT_AMBULATORY_CARE_PROVIDER_SITE_OTHER): Payer: 59 | Admitting: Internal Medicine

## 2015-06-17 VITALS — BP 138/90 | HR 88 | Temp 99.7°F | Resp 20 | Ht 70.0 in | Wt 178.0 lb

## 2015-06-17 DIAGNOSIS — J309 Allergic rhinitis, unspecified: Secondary | ICD-10-CM

## 2015-06-17 DIAGNOSIS — I10 Essential (primary) hypertension: Secondary | ICD-10-CM

## 2015-06-17 NOTE — Progress Notes (Signed)
Subjective:    Patient ID: Angel Ellis, male    DOB: 1955/10/18, 60 y.o.   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  He was switched to beta blocker therapy.  Last visit and now drinks decaf coffee.  His PVCs have resolved.  He has tolerated by diastolic well  Past Medical History  Diagnosis Date  . GERD (gastroesophageal reflux disease)   . Diverticulosis   . Hypertension   . Nasal congestion   . Lipoma of neck     L neck-remains in place  . Diverticulitis of colon 10/29/2009    After august 1995    . Sebaceous cyst 01/07/2009    R axilla      Social History   Social History  . Marital Status: Single    Spouse Name: N/A  . Number of Children: N/A  . Years of Education: N/A   Occupational History  . Not on file.   Social History Main Topics  . Smoking status: Former Smoker -- 1.00 packs/day for 37 years    Types: Cigarettes    Quit date: 12/08/2009  . Smokeless tobacco: Never Used  . Alcohol Use: 8.4 oz/week    14 Standard drinks or equivalent per week     Comment: one per day  . Drug Use: No  . Sexual Activity: No   Other Topics Concern  . Not on file   Social History Narrative   Domestic partner, not sexually active [name] also a patient here). No kids. Shares home with a friend-bought together.       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     Past Surgical History  Procedure Laterality Date  . None      Family History  Problem Relation Age of Onset  . Heart disease Mother     stopped psychiatric meds and BP meds, later with MI  . Heart disease Father     sleep apnea not controlled. 74.   . Hypertension Mother   . Schizophrenia Mother     Allergies  Allergen Reactions  . Aspirin Other  (See Comments)    Abdominal cramping, gi discomfort  . Penicillins Palpitations    Makes heart race    Current Outpatient Prescriptions on File Prior to Visit  Medication Sig Dispense Refill  . ALPRAZolam (XANAX) 0.5 MG tablet Take 1 tablet (0.5 mg total) by mouth at bedtime as needed for sleep. 5 tablet 4  . colchicine (COLCRYS) 0.6 MG tablet take 1 tablet by mouth every 1 to 2 hours UNTIL ONE OF THE FOLLOWING OCCURS.1.THE PAIN IS GONE.OR 2.THE MAXIMUM DOSE GIVEN IS NO MORE THAN 3 10 tablet 5  . hydroxypropyl methylcellulose / hypromellose (ISOPTO TEARS / GONIOVISC) 2.5 % ophthalmic solution Place 1 drop into both eyes 3 (three) times daily as needed for dry eyes.    . nebivolol (BYSTOLIC) 5 MG tablet Take 1 tablet (5 mg total) by mouth daily. 30 tablet 5  . omeprazole (PRILOSEC) 20 MG capsule TAKE 1 CAPSULE BY MOUTH ONCE DAILY 30 capsule 5  . rosuvastatin (CRESTOR) 20 MG tablet Take 1 tablet (20 mg total) by mouth once a week. (Patient taking differently: Take 20 mg by mouth once a week. Wed) 30 tablet  1   No current facility-administered medications on file prior to visit.    BP 138/90 mmHg  Pulse 88  Temp(Src) 99.7 F (37.6 C) (Oral)  Resp 20  Ht 5\' 10"  (1.778 m)  Wt 178 lb (80.74 kg)  BMI 25.54 kg/m2  SpO2 98%     Review of Systems  Constitutional: Positive for activity change, appetite change and fatigue. Negative for fever and chills.  HENT: Positive for congestion and postnasal drip. Negative for dental problem, ear pain, hearing loss, sore throat, tinnitus, trouble swallowing and voice change.   Eyes: Negative for pain, discharge and visual disturbance.  Respiratory: Positive for cough. Negative for chest tightness, wheezing and stridor.   Cardiovascular: Negative for chest pain, palpitations and leg swelling.  Gastrointestinal: Negative for nausea, vomiting, abdominal pain, diarrhea, constipation, blood in stool and abdominal distention.  Genitourinary: Negative for  urgency, hematuria, flank pain, discharge, difficulty urinating and genital sores.  Musculoskeletal: Negative for myalgias, back pain, joint swelling, arthralgias, gait problem and neck stiffness.  Skin: Negative for rash.  Neurological: Negative for dizziness, syncope, speech difficulty, weakness, numbness and headaches.  Hematological: Negative for adenopathy. Does not bruise/bleed easily.  Psychiatric/Behavioral: Negative for behavioral problems and dysphoric mood. The patient is not nervous/anxious.        Objective:   Physical Exam  Constitutional: He is oriented to person, place, and time. He appears well-developed.  Blood pressure 130/82 O2 saturation 98, temperature 99.7   HENT:  Head: Normocephalic.  Right Ear: External ear normal.  Left Ear: External ear normal.  Eyes: Conjunctivae and EOM are normal.  Neck: Normal range of motion.  Cardiovascular: Normal rate and normal heart sounds.   Pulmonary/Chest: Breath sounds normal. No respiratory distress. He has no wheezes. He has no rales.  Abdominal: Bowel sounds are normal.  Musculoskeletal: Normal range of motion. He exhibits no edema or tenderness.  Neurological: He is alert and oriented to person, place, and time.  Psychiatric: He has a normal mood and affect. His behavior is normal.          Assessment & Plan:   Viral URI with cough.  Will treat symptomatically Hypertension.  Well controlled on beta blocker therapy PVCs.  Presently asymptomatic.  Continue beta blocker therapy and caffeine restriction   Note to be out of work until February 10 dictated

## 2015-06-17 NOTE — Patient Instructions (Signed)
Acute bronchitis symptoms for less than 10 days are generally not helped by antibiotics.  Take over-the-counter expectorants and cough medications such as  Mucinex DM.  Call if there is no improvement in 5 to 7 days or if  you develop worsening cough, fever, or new symptoms, such as shortness of breath or chest pain.  HOME CARE INSTRUCTIONS  Drink plenty of water. Water helps thin the mucus so your sinuses can drain more easily.  Use a humidifier.  Inhale steam 3-4 times a day (for example, sit in the bathroom with the shower running).  Apply a warm, moist washcloth to your face 3-4 times a day, or as directed by your health care provider.  Use saline nasal sprays to help moisten and clean your sinuses

## 2015-06-17 NOTE — Progress Notes (Signed)
Pre visit review using our clinic review tool, if applicable. No additional management support is needed unless otherwise documented below in the visit note. 

## 2015-07-07 MED FILL — ALPRAZolam 0.5 MG TABS: 0.5 | 5 days supply | Qty: 5 | Fill #2

## 2015-07-07 MED FILL — BYSTOLIC 5 MG TABLET: 5 | 30 days supply | Qty: 30 | Fill #1

## 2015-07-25 IMAGING — CR DG ANKLE COMPLETE 3+V*L*
3 series · 3 of 3 positions shown · non-contrast
Comparison: None.

CLINICAL DATA: Proximal ankle pain, swelling. Inversion injury,
fell down steps 2 nights ago.

EXAM:
LEFT ANKLE COMPLETE - 3+ VIEW

[x ankle ap left]
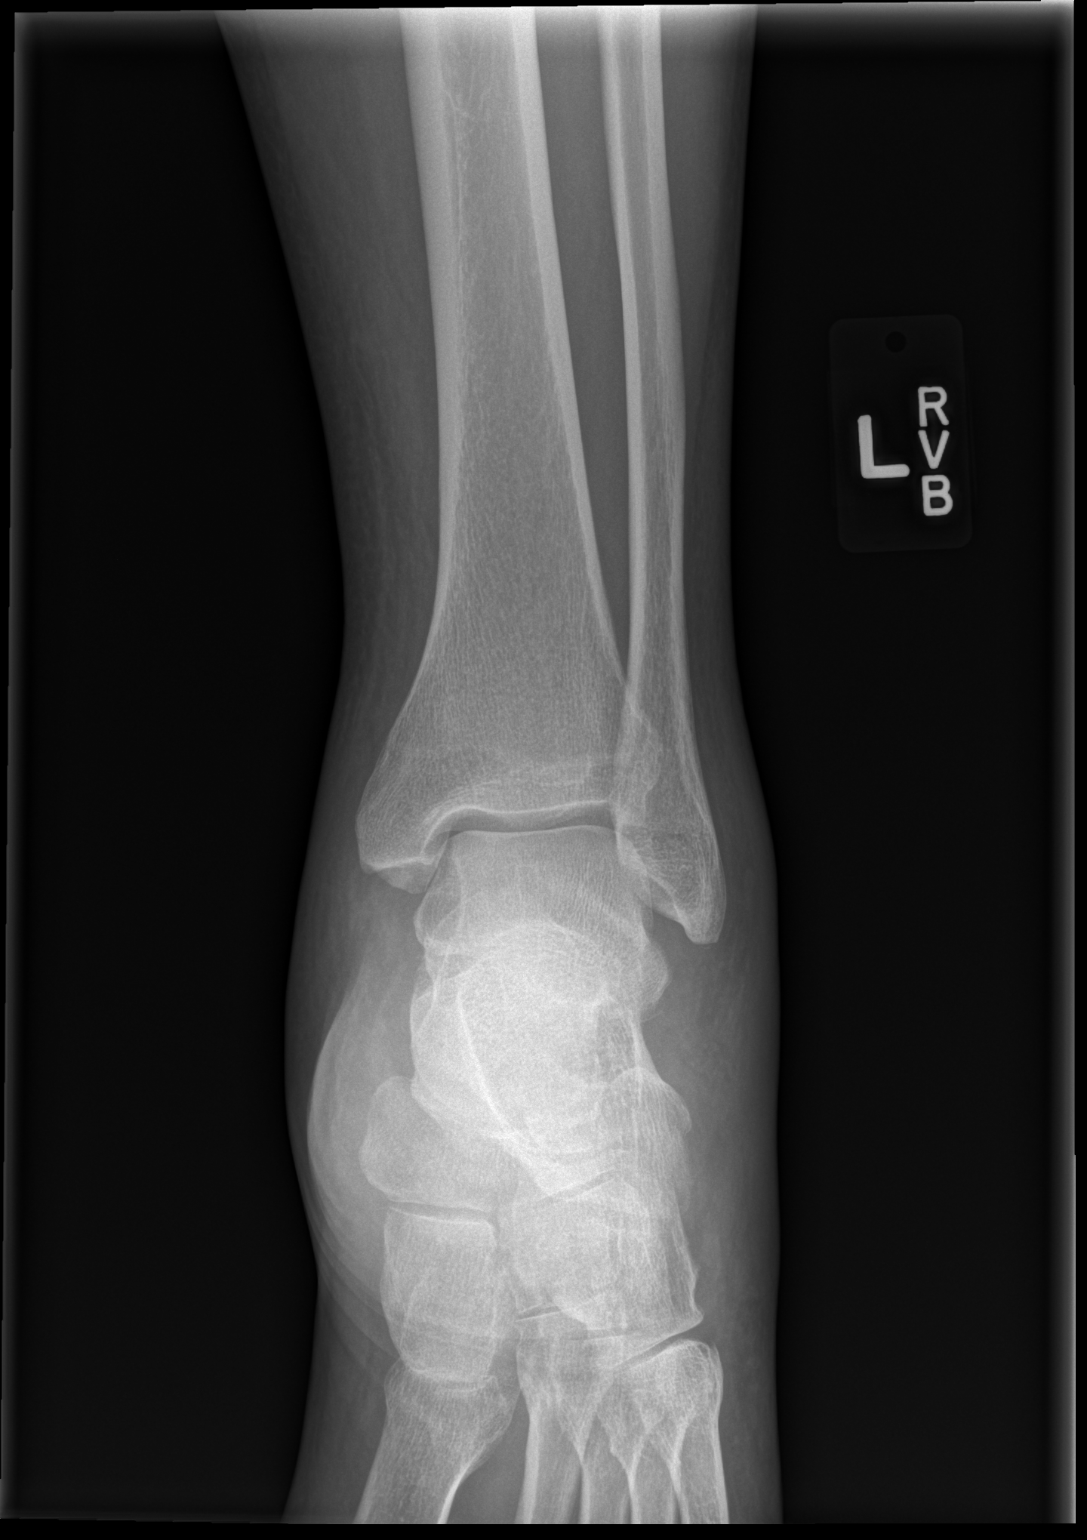

[x ankle obl left]
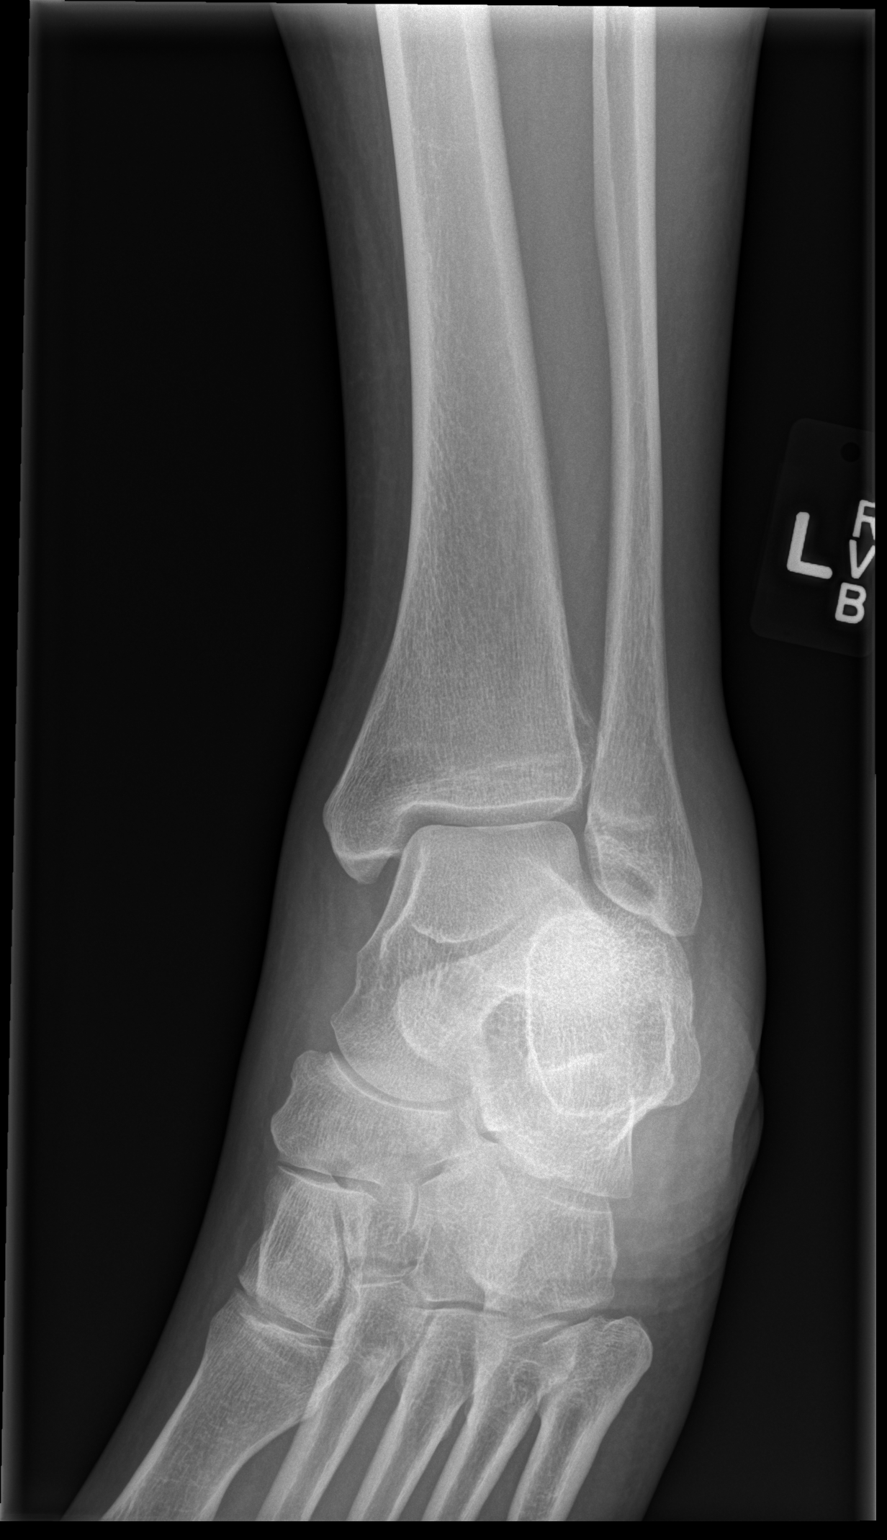

[x ankle lat left]
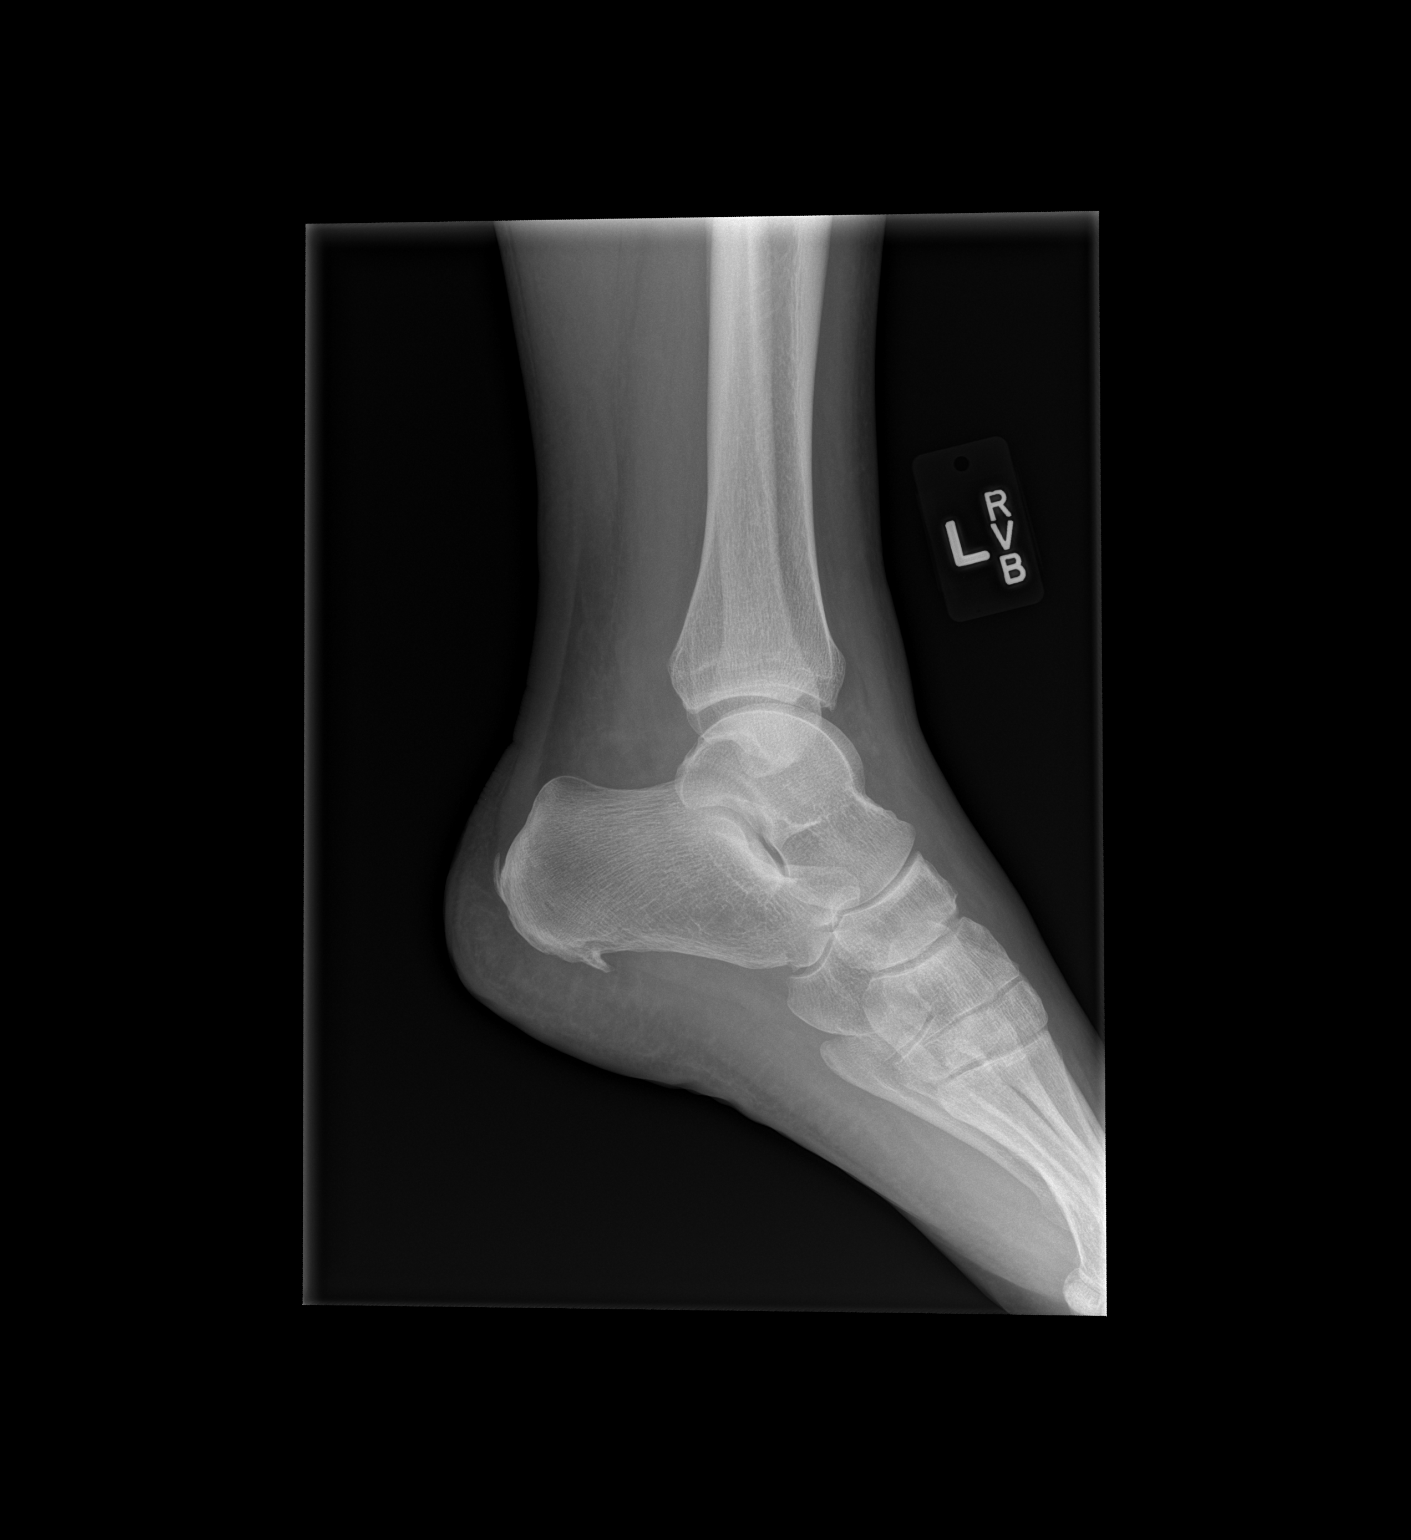

[3 of 3 positions shown; findings below may reference images not displayed]

FINDINGS: Diffuse soft tissue swelling about the left ankle. Small plantar
calcaneal spur. No fracture, subluxation or dislocation.
IMPRESSION: No acute bony abnormality.

## 2015-08-05 MED FILL — BYSTOLIC 5 MG TABLET: 5 | 30 days supply | Qty: 30 | Fill #2

## 2015-08-05 MED FILL — COLCHICINE 0.6 MG TABLET: 0.6 | 3 days supply | Qty: 10 | Fill #2

## 2015-08-06 MED FILL — ALPRAZolam 0.5 MG TABS: 0.5 | 5 days supply | Qty: 5 | Fill #3

## 2015-08-19 ENCOUNTER — Other Ambulatory Visit: Payer: Self-pay | Admitting: Family Medicine

## 2015-08-19 MED FILL — OMEPRAZOLE DR 20 MG CAPSULE: 20 | 90 days supply | Qty: 90 | Fill #0

## 2015-09-04 ENCOUNTER — Other Ambulatory Visit: Payer: Self-pay

## 2015-09-04 MED ORDER — ALPRAZOLAM 0.5 MG PO TABS: 0.5000 mg | ORAL_TABLET | Freq: Every evening | ORAL | Status: DC | PRN

## 2015-09-04 MED FILL — ALPRAZolam 0.5 MG TABS: 0.5 | 30 days supply | Qty: 30 | Fill #0

## 2015-09-04 NOTE — Telephone Encounter (Signed)
Yes thanks 

## 2015-09-04 NOTE — Telephone Encounter (Signed)
Refill ok? 

## 2015-09-17 MED FILL — BYSTOLIC 5 MG TABLET: 5 | 30 days supply | Qty: 30 | Fill #3

## 2015-09-17 MED FILL — ROSUVASTATIN CALCIUM 20 MG: 20 | 84 days supply | Qty: 12 | Fill #2

## 2015-09-26 ENCOUNTER — Other Ambulatory Visit: Payer: Self-pay | Admitting: Family Medicine

## 2015-09-26 ENCOUNTER — Other Ambulatory Visit (INDEPENDENT_AMBULATORY_CARE_PROVIDER_SITE_OTHER): Payer: 59

## 2015-09-26 DIAGNOSIS — Z0000 Encounter for general adult medical examination without abnormal findings: Secondary | ICD-10-CM | POA: Diagnosis not present

## 2015-09-26 DIAGNOSIS — Z20828 Contact with and (suspected) exposure to other viral communicable diseases: Secondary | ICD-10-CM | POA: Diagnosis not present

## 2015-09-26 LAB — CBC WITH DIFFERENTIAL/PLATELET
BASOS ABS: 0 10*3/uL (ref 0.0–0.1)
Basophils Relative: 0.9 % (ref 0.0–3.0)
EOS ABS: 0.4 10*3/uL (ref 0.0–0.7)
Eosinophils Relative: 7.9 % — ABNORMAL HIGH (ref 0.0–5.0)
HEMATOCRIT: 39.9 % (ref 39.0–52.0)
HEMOGLOBIN: 13.5 g/dL (ref 13.0–17.0)
LYMPHS PCT: 45.2 % (ref 12.0–46.0)
Lymphs Abs: 2.2 10*3/uL (ref 0.7–4.0)
MCHC: 33.8 g/dL (ref 30.0–36.0)
MCV: 89.7 fl (ref 78.0–100.0)
Monocytes Absolute: 0.5 10*3/uL (ref 0.1–1.0)
Monocytes Relative: 9.9 % (ref 3.0–12.0)
Neutro Abs: 1.8 10*3/uL (ref 1.4–7.7)
Neutrophils Relative %: 36.1 % — ABNORMAL LOW (ref 43.0–77.0)
Platelets: 212 10*3/uL (ref 150.0–400.0)
RBC: 4.44 Mil/uL (ref 4.22–5.81)
RDW: 13.6 % (ref 11.5–15.5)
WBC: 4.9 10*3/uL (ref 4.0–10.5)

## 2015-09-26 LAB — POC URINALSYSI DIPSTICK (AUTOMATED)
Bilirubin, UA: NEGATIVE
Blood, UA: NEGATIVE
GLUCOSE UA: NEGATIVE
KETONES UA: NEGATIVE
Leukocytes, UA: NEGATIVE
Nitrite, UA: NEGATIVE
Protein, UA: NEGATIVE
Urobilinogen, UA: 0.2
pH, UA: 5.5

## 2015-09-26 LAB — HEPATIC FUNCTION PANEL
ALK PHOS: 50 U/L (ref 39–117)
ALT: 40 U/L (ref 0–53)
AST: 37 U/L (ref 0–37)
Albumin: 4.1 g/dL (ref 3.5–5.2)
BILIRUBIN DIRECT: 0.1 mg/dL (ref 0.0–0.3)
TOTAL PROTEIN: 6.8 g/dL (ref 6.0–8.3)
Total Bilirubin: 0.7 mg/dL (ref 0.2–1.2)

## 2015-09-26 LAB — LIPID PANEL
CHOL/HDL RATIO: 3
Cholesterol: 214 mg/dL — ABNORMAL HIGH (ref 0–200)
HDL: 71.9 mg/dL (ref >39.00)
LDL Cholesterol: 117 mg/dL — ABNORMAL HIGH (ref 0–99)
NONHDL: 142.18
Triglycerides: 127 mg/dL (ref 0.0–149.0)
VLDL: 25.4 mg/dL (ref 0.0–40.0)

## 2015-09-26 LAB — PSA: PSA: 0.76 ng/mL (ref 0.10–4.00)

## 2015-09-26 LAB — BASIC METABOLIC PANEL
BUN: 14 mg/dL (ref 6–23)
CALCIUM: 9 mg/dL (ref 8.4–10.5)
CHLORIDE: 101 meq/L (ref 96–112)
CO2: 26 mEq/L (ref 19–32)
CREATININE: 0.77 mg/dL (ref 0.40–1.50)
GFR: 109.46 mL/min (ref >60.00)
Glucose, Bld: 89 mg/dL (ref 70–99)
Potassium: 3.9 mEq/L (ref 3.5–5.1)
SODIUM: 136 meq/L (ref 135–145)

## 2015-09-26 LAB — TSH: TSH: 1.12 u[IU]/mL (ref 0.35–4.50)

## 2015-09-27 LAB — HIV ANTIBODY (ROUTINE TESTING W REFLEX): HIV 1&2 Ab, 4th Generation: NONREACTIVE

## 2015-09-27 LAB — HEPATITIS C ANTIBODY: HCV AB: NEGATIVE

## 2015-10-03 ENCOUNTER — Ambulatory Visit (INDEPENDENT_AMBULATORY_CARE_PROVIDER_SITE_OTHER): Payer: 59 | Admitting: Family Medicine

## 2015-10-03 ENCOUNTER — Encounter: Payer: Self-pay | Admitting: Family Medicine

## 2015-10-03 VITALS — BP 138/80 | HR 79 | Ht 70.08 in | Wt 176.0 lb

## 2015-10-03 DIAGNOSIS — Z0000 Encounter for general adult medical examination without abnormal findings: Secondary | ICD-10-CM

## 2015-10-03 DIAGNOSIS — Z23 Encounter for immunization: Secondary | ICD-10-CM | POA: Diagnosis not present

## 2015-10-03 MED ORDER — NEBIVOLOL HCL 5 MG PO TABS: 5.0000 mg | ORAL_TABLET | Freq: Every day | ORAL | Status: DC

## 2015-10-03 MED ORDER — OMEPRAZOLE 20 MG PO CPDR: 20.0000 mg | DELAYED_RELEASE_CAPSULE | Freq: Every day | ORAL | Status: DC

## 2015-10-03 MED ORDER — ROSUVASTATIN CALCIUM 20 MG PO TABS: ORAL_TABLET | ORAL | Status: DC

## 2015-10-03 NOTE — Progress Notes (Signed)
Phone: [phone]  Subjective:  Patient presents today for their annual physical. Chief complaint-noted.   See problem oriented charting- ROS- full  review of systems was completed and negative including No chest pain or shortness of breath. No headache or blurry vision.   The following were reviewed and entered/updated in epic: Past Medical History  Diagnosis Date  . GERD (gastroesophageal reflux disease)   . Diverticulosis   . Hypertension   . Nasal congestion   . Lipoma of neck     L neck-remains in place  . Diverticulitis of colon 10/29/2009    After august 1995    . Sebaceous cyst 01/07/2009    R axilla     Patient Active Problem List   Diagnosis Date Noted  . Gout 07/23/2014    Priority: Medium  . Essential hypertension 07/23/2014    Priority: Medium  . 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 Former smoker 06/29/2007    Priority: Medium  . Hyperlipidemia 02/15/2007    Priority: Medium  . Allergic rhinitis 07/23/2014    Priority: Low  . Rosacea 07/23/2014    Priority: Low  . Basal cell carcinoma of skin 01/07/2009    Priority: Low  . GERD 02/15/2007    Priority: Low   Past Surgical History  Procedure Laterality Date  . None      Family History  Problem Relation Age of Onset  . Heart disease Mother     stopped psychiatric meds and BP meds, later with MI  . Heart disease Father     sleep apnea not controlled. 74.   . Hypertension Mother   . Schizophrenia Mother     Medications- reviewed and updated Current Outpatient Prescriptions  Medication Sig Dispense Refill  . ALPRAZolam (XANAX) 0.5 MG tablet Take 1 tablet (0.5 mg total) by mouth at bedtime as needed for sleep. 30 tablet 5  . colchicine (COLCRYS) 0.6 MG tablet take 1 tablet by mouth every 1 to 2 hours UNTIL ONE OF THE FOLLOWING OCCURS.1.THE PAIN IS GONE.OR 2.THE MAXIMUM DOSE GIVEN IS NO MORE THAN 3 10 tablet 5  . hydroxypropyl methylcellulose / hypromellose (ISOPTO TEARS / GONIOVISC)  2.5 % ophthalmic solution Place 1 drop into both eyes 3 (three) times daily as needed for dry eyes.    . nebivolol (BYSTOLIC) 5 MG tablet Take 1 tablet (5 mg total) by mouth daily. 30 tablet 5  . omeprazole (PRILOSEC) 20 MG capsule TAKE 1 CAPSULE BY MOUTH ONCE DAILY 30 capsule 5  . rosuvastatin (CRESTOR) 20 MG tablet Take 1 tablet (20 mg total) by mouth once a week. (Patient taking differently: Take 20 mg by mouth once a week. Wed) 30 tablet 1   No current facility-administered medications for this visit.    Allergies-reviewed and updated Allergies  Allergen Reactions  . Aspirin Other (See Comments)    Abdominal cramping, gi discomfort  . Milk-Related Compounds     GI Symptoms  . Penicillins Palpitations    Makes heart race    Social History   Social History  . Marital Status: Single    Spouse Name: N/A  . Number of Children: N/A  . Years of Education: N/A   Social History Main Topics  . Smoking status: Former Smoker -- 1.00 packs/day for 37 years    Types: Cigarettes    Quit date: 12/08/2009  . Smokeless tobacco: Never Used  . Alcohol Use: 8.4 oz/week    14 Standard drinks or equivalent per week  Comment: one per day  . Drug Use: No  . Sexual Activity: No   Other Topics Concern  . None   Social History Narrative   Domestic partner, not sexually active [name] also a patient here). No kids. Shares home with a friend-bought together.       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     Objective: BP 138/80 mmHg  Pulse 79  Ht 5' 10.08" (1.78 m)  Wt 176 lb (79.833 kg)  BMI 25.20 kg/m2  SpO2 98% Gen: NAD, resting comfortably HEENT: Mucous membranes are moist. Oropharynx normal Neck: no thyromegaly CV: RRR no murmurs rubs or gallops Lungs: CTAB no crackles, wheeze, rhonchi Abdomen: soft/nontender/nondistended/normal bowel sounds. No rebound or guarding.  Rectal: normal tone, normal prostate, no masses or  tenderness Ext: no edema Skin: warm, dry Neuro: grossly normal, moves all extremities, PERRLA  Assessment/Plan:  60 y.o. male presenting for annual physical.  Health Maintenance counseling: 1. Anticipatory guidance: Patient counseled regarding regular dental exams, eye exams, wearing seatbelts.  2. 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  3. Immunizations/screenings/ancillary studies Immunization History  Administered Date(s) Administered  . Influenza,inj,Quad PF,36+ Mos 01/08/2013  . Influenza-Unspecified 02/23/2015  . Td 08/08/2005   Health Maintenance Due  Topic Date Due  . ZOSTAVAX - declines for now 07/14/2015  . TETANUS/TDAP - offered today, given  08/09/2015   4. Prostate cancer screening- low risk based on psa trend and rectal   Lab Results  Component Value Date   PSA 0.76 09/26/2015   PSA 0.66 03/14/2013   PSA 0.76 11/26/2011   5. Colon cancer screening - 01/30/2009  Normal so 10 year repeat 6. Skin cancer screening- sees Dr. Wilhemina Bonito  Status of chronic or acute concerns  Hypertension- controlled on bystolic 5mg   Hyperlipidemia controlled on crestor 20mg  once a week, trial twice a week  GERD controlled on prilosec 20mg , discussed zantac option   Gout- has colcrys available. Months and months ago   Sebaceous cyst vs lipoma in right axilla- no change in size. Freely mobile. Continue to monitor  Doesn't tolerate oral allergy medicines. Advised trial flonase  Return in about 6 months (around 04/04/2016) for follow up- or sooner if needed. Return precautions advised.    Meds ordered this encounter  Medications  . rosuvastatin (CRESTOR) 20 MG tablet    Sig: Take twice a week (wednesday and Saturday)    Dispense:  27 tablet    Refill:  3  . nebivolol (BYSTOLIC) 5 MG tablet    Sig: Take 1 tablet (5 mg  total) by mouth daily.    Dispense:  90 tablet    Refill:  3  . omeprazole (PRILOSEC) 20 MG capsule    Sig: Take 1 capsule (20 mg total) by mouth daily.    Dispense:  90 capsule    Refill:  3    Garret Reddish, MD

## 2015-10-03 NOTE — Patient Instructions (Addendum)
crestor trial twice a week  Consider trial of zantac for at least a week and if effective, switch over  Tdap today

## 2015-10-21 MED FILL — BYSTOLIC 5 MG TABLET: 5 | 30 days supply | Qty: 30 | Fill #4

## 2015-10-27 DIAGNOSIS — M47816 Spondylosis without myelopathy or radiculopathy, lumbar region: Secondary | ICD-10-CM | POA: Diagnosis not present

## 2015-10-27 DIAGNOSIS — M9903 Segmental and somatic dysfunction of lumbar region: Secondary | ICD-10-CM | POA: Diagnosis not present

## 2015-10-29 DIAGNOSIS — M47816 Spondylosis without myelopathy or radiculopathy, lumbar region: Secondary | ICD-10-CM | POA: Diagnosis not present

## 2015-10-29 DIAGNOSIS — M9903 Segmental and somatic dysfunction of lumbar region: Secondary | ICD-10-CM | POA: Diagnosis not present

## 2015-11-12 MED FILL — ALPRAZolam 0.5 MG TABS: 0.5 | 30 days supply | Qty: 30 | Fill #1

## 2015-11-19 MED FILL — BYSTOLIC 5 MG TABLET: 5 | 90 days supply | Qty: 90 | Fill #0

## 2015-11-25 DIAGNOSIS — M47816 Spondylosis without myelopathy or radiculopathy, lumbar region: Secondary | ICD-10-CM | POA: Diagnosis not present

## 2015-11-25 DIAGNOSIS — M9903 Segmental and somatic dysfunction of lumbar region: Secondary | ICD-10-CM | POA: Diagnosis not present

## 2015-12-01 MED FILL — TOBRAMYCIN-DEXAMETH OPTH SU: 0.3-0.1 | 7 days supply | Qty: 5 | Fill #1

## 2015-12-10 MED FILL — OMEPRAZOLE DR 20 MG CAPSULE: 20 | 90 days supply | Qty: 90 | Fill #1

## 2016-01-27 MED FILL — ALPRAZolam 0.5 MG TABS: 0.5 | 30 days supply | Qty: 30 | Fill #2

## 2016-02-20 MED FILL — BYSTOLIC 5 MG TABLET: 5 | 90 days supply | Qty: 90 | Fill #1

## 2016-03-01 ENCOUNTER — Encounter: Payer: Self-pay | Admitting: Family Medicine

## 2016-03-01 ENCOUNTER — Ambulatory Visit (INDEPENDENT_AMBULATORY_CARE_PROVIDER_SITE_OTHER): Payer: 59 | Admitting: Family Medicine

## 2016-03-01 VITALS — BP 102/82 | HR 66 | Temp 98.0°F | Wt 176.8 lb

## 2016-03-01 DIAGNOSIS — N210 Calculus in bladder: Secondary | ICD-10-CM | POA: Diagnosis not present

## 2016-03-01 LAB — URINALYSIS, MICROSCOPIC ONLY

## 2016-03-01 NOTE — Progress Notes (Signed)
Pre visit review using our clinic review tool, if applicable. No additional management support is needed unless otherwise documented below in the visit note. 

## 2016-03-02 NOTE — Progress Notes (Signed)
Subjective:  Angel Ellis is a 60 y.o. year old very pleasant male patient who presents for/with See problem oriented charting ROS- mild discomfort with urination when passing small stones. No increase in nocturia or polyuriaNo pain in CVA area. No fever/chills/nausea/vomiting.see any ROS included in HPI as well.   Past Medical History-  Patient Active Problem List   Diagnosis Date Noted  . Gout 07/23/2014    Priority: Medium  . Essential hypertension 07/23/2014    Priority: Medium  . 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 Former smoker 06/29/2007    Priority: Medium  . Hyperlipidemia 02/15/2007    Priority: Medium  . Allergic rhinitis 07/23/2014    Priority: Low  . Rosacea 07/23/2014    Priority: Low  . Basal cell carcinoma of skin 01/07/2009    Priority: Low  . GERD 02/15/2007    Priority: Low    Medications- reviewed and updated Current Outpatient Prescriptions  Medication Sig Dispense Refill  . ALPRAZolam (XANAX) 0.5 MG tablet Take 1 tablet (0.5 mg total) by mouth at bedtime as needed for sleep. 30 tablet 5  . colchicine (COLCRYS) 0.6 MG tablet take 1 tablet by mouth every 1 to 2 hours UNTIL ONE OF THE FOLLOWING OCCURS.1.THE PAIN IS GONE.OR 2.THE MAXIMUM DOSE GIVEN IS NO MORE THAN 3 10 tablet 5  . hydroxypropyl methylcellulose / hypromellose (ISOPTO TEARS / GONIOVISC) 2.5 % ophthalmic solution Place 1 drop into both eyes 3 (three) times daily as needed for dry eyes.    . nebivolol (BYSTOLIC) 5 MG tablet Take 1 tablet (5 mg total) by mouth daily. 90 tablet 3  . omeprazole (PRILOSEC) 20 MG capsule Take 1 capsule (20 mg total) by mouth daily. 90 capsule 3  . rosuvastatin (CRESTOR) 20 MG tablet Take twice a week (wednesday and Saturday) 27 tablet 3   No current facility-administered medications for this visit.     Objective: BP 102/82 (BP Location: Left Arm, Patient Position: Sitting, Cuff Size: Large)   Pulse 66   Temp 98 F (36.7 C) (Oral)   Wt 176 lb 12.8 oz  (80.2 kg)   SpO2 95%   BMI 25.31 kg/m  Gen: NAD, resting comfortably CV: RRR no murmurs rubs or gallops Lungs: CTAB no crackles, wheeze, rhonchi Abdomen: soft/nontender/nondistended/normal bowel sounds. No rebound or guarding.  Ext: no edema  Picture of small stones patient brought largest 2-3 mm    Results for orders placed or performed in visit on 03/01/16 (from the past 24 hour(s))  Urine Microscopic     Status: Abnormal   Collection Time: 03/01/16  2:56 PM  Result Value Ref Range   WBC, UA 0-2/hpf 0-2/hpf   RBC / HPF 0-2/hpf 0-2/hpf   Mucus, UA Presence of (A) None   Squamous Epithelial / LPF Rare(0-4/hpf) Rare(0-4/hpf)   Assessment/Plan:  Bladder stones - Plan: POCT Urinalysis Dipstick (Automated), Urine Microscopic S: Patient states a few years ago with onset of gout he started passing some small amber colored stones in urineeach time he urinated and stopped after 2-3 weeks.   Started again about 2 weeks ago with small copper stones passing each tim ehe urinates- he brings some of them with him today.  A/P: I list bladder stones but I am unclear of etiology of stones patient is passing. Urine microscopic is reassuring. Will also refer to urology  [facility] if these could be uric acid related. He has had an elevated uric acid of above 8 in the past but  only uses prn colchicine with last use in 2015- may need to consider allopurinol but want to get urology's opinion on if this is cause before starting.   Orders Placed This Encounter  Procedures  . Urine Microscopic  . Ambulatory referral to Urology    Referral Priority:   Routine    Referral Type:   Consultation    Referral Reason:   Specialty Services Required    Requested Specialty:   Urology    Number of Visits Requested:   1  . POCT Urinalysis Dipstick (Automated)   Return precautions advised.  Garret Reddish, MD

## 2016-03-02 NOTE — Patient Instructions (Signed)
internet down at time of visit- handwritten avs given

## 2016-03-03 MED FILL — OMEPRAZOLE DR 20 MG CAPSULE: 20 | 90 days supply | Qty: 90 | Fill #0

## 2016-04-07 DIAGNOSIS — H10503 Unspecified blepharoconjunctivitis, bilateral: Secondary | ICD-10-CM | POA: Diagnosis not present

## 2016-04-07 MED FILL — TOBRAMYCIN-DEXAMETH OPTH SU: 0.3-0.1 | 13 days supply | Qty: 5 | Fill #0

## 2016-04-22 ENCOUNTER — Other Ambulatory Visit: Payer: Self-pay | Admitting: Emergency Medicine

## 2016-04-22 DIAGNOSIS — N210 Calculus in bladder: Secondary | ICD-10-CM | POA: Diagnosis not present

## 2016-04-22 DIAGNOSIS — N400 Enlarged prostate without lower urinary tract symptoms: Secondary | ICD-10-CM | POA: Diagnosis not present

## 2016-04-22 NOTE — Telephone Encounter (Signed)
Pt is requesting refill for Alprazolam 0.5mg . This prescription was last refilled 09/02/2015 for 30 tabs 1 tab daily at bedtime. Pt last appointment for  Please advise.

## 2016-04-23 ENCOUNTER — Other Ambulatory Visit: Payer: Self-pay | Admitting: Family Medicine

## 2016-04-26 MED FILL — ALPRAZolam 0.5 MG TABS: 0.5 | 30 days supply | Qty: 30 | Fill #0

## 2016-05-19 MED FILL — BYSTOLIC 5 MG TABLET: 5 | 90 days supply | Qty: 90 | Fill #2

## 2016-06-03 MED FILL — OMEPRAZOLE DR 20 MG CAPSULE: 20 | 90 days supply | Qty: 90 | Fill #1

## 2016-06-15 MED FILL — TOBRAMYCIN-DEXAMETH OPTH SU: 0.3-0.1 | 13 days supply | Qty: 5 | Fill #1

## 2016-08-16 MED FILL — ROSUVASTATIN CALCIUM 20 MG: 20 | 84 days supply | Qty: 24 | Fill #0

## 2016-08-18 MED FILL — ALPRAZolam 0.5 MG TABS: 0.5 | 30 days supply | Qty: 30 | Fill #1

## 2016-08-18 MED FILL — BYSTOLIC 5 MG TABLET: 5 | 90 days supply | Qty: 90 | Fill #3

## 2016-08-31 MED FILL — OMEPRAZOLE DR 20 MG CAPSULE: 20 | 90 days supply | Qty: 90 | Fill #2

## 2016-11-15 ENCOUNTER — Other Ambulatory Visit: Payer: Self-pay | Admitting: Family Medicine

## 2016-11-16 ENCOUNTER — Other Ambulatory Visit: Payer: Self-pay | Admitting: Family Medicine

## 2016-11-16 MED FILL — BYSTOLIC 5 MG TABLET: 5 | 90 days supply | Qty: 90 | Fill #0

## 2016-11-16 MED FILL — ALPRAZolam 0.5 MG TABS: 0.5 | 30 days supply | Qty: 30 | Fill #0

## 2016-11-25 MED FILL — TOBRAMYCIN-DEXAMETH OPTH SU: 0.3-0.1 | 13 days supply | Qty: 5 | Fill #0

## 2016-11-30 MED FILL — OMEPRAZOLE DR 20 MG CAPSULE: 20 | 90 days supply | Qty: 90 | Fill #0

## 2016-12-09 ENCOUNTER — Telehealth: Payer: Self-pay

## 2016-12-09 NOTE — Telephone Encounter (Signed)
Patient is on the list for [facility] and may be a good candidate for an AWV. Please let me know if/when appt is scheduled.   

## 2017-02-09 MED FILL — ALPRAZolam 0.5 MG TABS: 0.5 | 30 days supply | Qty: 30 | Fill #1

## 2017-02-14 MED FILL — BYSTOLIC 5 MG TABLET: 5 | 90 days supply | Qty: 90 | Fill #1

## 2017-03-08 MED FILL — OMEPRAZOLE 20 MG CAP: 20 | 90 days supply | Qty: 90 | Fill #1

## 2017-04-19 MED FILL — ALPRAZolam 0.5 MG TABS: 0.5 | 30 days supply | Qty: 30 | Fill #2

## 2017-05-12 DIAGNOSIS — L718 Other rosacea: Secondary | ICD-10-CM | POA: Diagnosis not present

## 2017-05-12 DIAGNOSIS — L821 Other seborrheic keratosis: Secondary | ICD-10-CM | POA: Diagnosis not present

## 2017-05-12 DIAGNOSIS — D2271 Melanocytic nevi of right lower limb, including hip: Secondary | ICD-10-CM | POA: Diagnosis not present

## 2017-05-12 MED FILL — SSS 10-5 CREAM: 10-5 | 30 days supply | Qty: 28 | Fill #0

## 2017-05-14 ENCOUNTER — Ambulatory Visit (HOSPITAL_COMMUNITY)
Admission: EM | Admit: 2017-05-14 | Discharge: 2017-05-14 | Disposition: A | Discharge status: Home / Self Care | Payer: 59 | Attending: Family Medicine | Admitting: Family Medicine

## 2017-05-14 ENCOUNTER — Other Ambulatory Visit: Payer: Self-pay

## 2017-05-14 ENCOUNTER — Encounter (HOSPITAL_COMMUNITY): Payer: Self-pay | Admitting: Emergency Medicine

## 2017-05-14 DIAGNOSIS — H109 Unspecified conjunctivitis: Secondary | ICD-10-CM

## 2017-05-14 MED ORDER — TOBRAMYCIN-DEXAMETHASONE 0.3-0.1 % OP SUSP: 1.0000 [drp] | OPHTHALMIC | 0 refills | Status: AC

## 2017-05-14 NOTE — Discharge Instructions (Signed)
Will start course of tobradex today, may use to both eyes if right eye is affected. Please follow up with your eye doctor next week for a re check to ensure improvement. If develop pain, change in vision or otherwise worsening of symptoms return sooner or visit Ed.

## 2017-05-14 NOTE — ED Triage Notes (Addendum)
Left eye redness, no pain, eye does itch.  Reports this has been going on for a month.  Patient states he has recurrent episodes and tobramycin usually clears this.  Right eye is affected too, not nearly as bad

## 2017-05-14 NOTE — ED Provider Notes (Signed)
[facility]    CSN: [id number] Arrival date & time: 05/14/17  1201     History   Chief Complaint Chief Complaint  Patient presents with  . Eye Problem    HPI MERCER PEIFER is a 62 y.o. male.   Timotheus presents with complaints of left eye redness and drainage which worsened this morning. He states he has had intermittent eye irritation for years, especially the past month, which comes and goes. Has seen ophthalmology for this in the past. Has had to use tobradex multiple times for this when it worsens. He uses zatador eye drops as he has been told it is allergic in nature. Today his eye was mattered and redness and irritation is worse. Without pain or vision change. Does wear glasses but does not wear contacts. No trauma or known exposure to eye. Mild itching.    ROS per HPI.       Past Medical History:  Diagnosis Date  . Diverticulitis of colon 10/29/2009   After august 1995    . Diverticulosis   . GERD (gastroesophageal reflux disease)   . Hypertension   . Lipoma of neck    L neck-remains in place  . Nasal congestion   . Sebaceous cyst 01/07/2009   R axilla      Patient Active Problem List   Diagnosis Date Noted  . Gout 07/23/2014  . Allergic rhinitis 07/23/2014  . Essential hypertension 07/23/2014  . Rosacea 07/23/2014  . 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 Basal cell carcinoma of skin 01/07/2009  . Former smoker 06/29/2007  . Hyperlipidemia 02/15/2007  . GERD 02/15/2007    Past Surgical History:  Procedure Laterality Date  . none         Home Medications    Prior to Admission medications   Medication Sig Start Date End Date Taking? Authorizing Provider  BYSTOLIC 5 MG tablet TAKE 1 TABLET BY MOUTH ONCE DAILY 11/16/16  Yes Marin Olp, MD  omeprazole (PRILOSEC) 20 MG capsule TAKE 1 CAPSULE BY MOUTH DAILY. 11/16/16  Yes Marin Olp, MD  ALPRAZolam Duanne Moron) 0.5 MG tablet TAKE 1 TABLET BY MOUTH ONCE DAILY AS NEEDED FOR SLEEP 11/15/16   Marin Olp, MD  colchicine (COLCRYS) 0.6 MG tablet take 1 tablet by mouth every 1 to 2 hours UNTIL ONE OF THE FOLLOWING OCCURS.1.THE PAIN IS GONE.OR 2.THE MAXIMUM DOSE GIVEN IS NO MORE THAN 3 10/15/14   Marin Olp, MD  hydroxypropyl methylcellulose / hypromellose (ISOPTO TEARS / GONIOVISC) 2.5 % ophthalmic solution Place 1 drop into both eyes 3 (three) times daily as needed for dry eyes.    [provider]  tobramycin-dexamethasone [facility]) ophthalmic solution Place 1 drop into the left eye every 4 (four) hours while awake for 7 days. 05/14/17 05/21/17  Zigmund Gottron, NP    Family History Family History  Problem Relation Age of Onset  . Heart disease Mother        stopped psychiatric meds and BP meds, later with MI  . Hypertension Mother   . Schizophrenia Mother   . Heart disease Father        sleep apnea not controlled. 65.     Social History Social History   Tobacco Use  . Smoking status: Former Smoker    Packs/day: 1.00    Years: 37.00    Pack years: 37.00    Types: Cigarettes    Last attempt to quit: 12/08/2009    Years since quitting: 7.4  .  Smokeless tobacco: Never Used  Substance Use Topics  . Alcohol use: Yes    Alcohol/week: 8.4 oz    Types: 14 Standard drinks or equivalent per week    Comment: one per day  . Drug use: No     Allergies   Aspirin; Milk-related compounds; and Penicillins   Review of Systems Review of Systems   Physical Exam Triage Vital Signs ED Triage Vitals [05/14/17 1212]  Enc Vitals Group     BP (!) 145/84     Pulse Rate 63     Resp 18     Temp 98.4 F (36.9 C)     Temp Source Oral     SpO2 98 %     Weight      Height      Head Circumference      Peak Flow      Pain Score      Pain Loc      Pain Edu?      Excl. in [place]?    No data found.  Updated Vital Signs BP (!) 145/84 (BP Location: Right Arm)   Pulse 63   Temp 98.4 F (36.9 C) (Oral)   Resp 18   SpO2 98%   Visual Acuity Right Eye  Distance: 20/30 Left Eye Distance: 20/40 Bilateral Distance: 20/30  Right Eye Near:   Left Eye Near:    Bilateral Near:     Physical Exam  Constitutional: He is oriented to person, place, and time. He appears well-developed and well-nourished.  Eyes: EOM and lids are normal. Pupils are equal, round, and reactive to light. Left eye exhibits discharge. Left conjunctiva is injected.  Dried drainage to left lower lid and largely injected  Cardiovascular: Normal rate and regular rhythm.  Pulmonary/Chest: Effort normal and breath sounds normal.  Neurological: He is alert and oriented to person, place, and time.  Skin: Skin is warm and dry.     UC Treatments / Results  Labs (all labs ordered are listed, but only abnormal results are displayed) Labs Reviewed - No data to display  EKG  EKG Interpretation None       Radiology No results found.  Procedures Procedures (including critical care time)  Medications Ordered in UC Medications - No data to display   Initial Impression / Assessment and Plan / UC Course  I have reviewed the triage vital signs and the nursing notes.  Pertinent labs & imaging results that were available during my care of the patient were reviewed by me and considered in my medical decision making (see chart for details).     Tobradex for what appears to be consistent with acute on chronic conjunctivitis. Stable visual acuity. As this seems to be a frequent concern for patient recommended close follow up with ophthalmologist for recheck of eye as well as for further discussion of future treatment/prevention. Patient verbalized understanding and agreeable to plan.    Final Clinical Impressions(s) / UC Diagnoses   Final diagnoses:  Conjunctivitis of left eye, unspecified conjunctivitis type    ED Discharge Orders        Ordered    tobramycin-dexamethasone (TOBRADEX) ophthalmic solution  Every 4 hours while awake     05/14/17 1228        Controlled Substance Prescriptions [place] Controlled Substance Registry consulted? Not Applicable   Zigmund Gottron, NP 05/14/17 1234

## 2017-05-16 MED FILL — BYSTOLIC 5 MG TABLET: 5 | 90 days supply | Qty: 90 | Fill #2

## 2017-06-14 MED FILL — OMEPRAZOLE 20 MG CAP: 20 | 90 days supply | Qty: 90 | Fill #2

## 2017-06-14 MED FILL — TOBRAMYCIN-DEXAMETH OPTH SU: 0.3-0.1 | 17 days supply | Qty: 5 | Fill #0

## 2017-08-15 ENCOUNTER — Other Ambulatory Visit: Payer: Self-pay | Admitting: Family Medicine

## 2017-08-15 MED FILL — BYSTOLIC 5 MG TABLET: 5 | 90 days supply | Qty: 90 | Fill #3

## 2017-08-17 NOTE — Telephone Encounter (Signed)
Xanax 0.5 mg tab refill request - controlled substance  LOV 10/03/15 with Dr. Tonita Phoenix [facility] - [place], [place]  [address] [facility].

## 2017-08-17 NOTE — Telephone Encounter (Signed)
Pt is out of med

## 2017-08-17 NOTE — Telephone Encounter (Signed)
See note

## 2017-08-18 NOTE — Telephone Encounter (Signed)
Patient scheduled.

## 2017-08-18 NOTE — Telephone Encounter (Signed)
Please contact patient to scheduled office visit for medication refill. Patient needs an appointment (Has not been seen since 10/03/2015)

## 2017-08-23 ENCOUNTER — Encounter: Payer: Self-pay | Admitting: Family Medicine

## 2017-08-23 ENCOUNTER — Ambulatory Visit: Payer: 59 | Admitting: Family Medicine

## 2017-08-23 VITALS — BP 118/80 | HR 71 | Temp 98.4°F | Ht 70.08 in | Wt 174.8 lb

## 2017-08-23 DIAGNOSIS — G4700 Insomnia, unspecified: Secondary | ICD-10-CM | POA: Diagnosis not present

## 2017-08-23 DIAGNOSIS — I10 Essential (primary) hypertension: Secondary | ICD-10-CM | POA: Diagnosis not present

## 2017-08-23 DIAGNOSIS — E785 Hyperlipidemia, unspecified: Secondary | ICD-10-CM

## 2017-08-23 DIAGNOSIS — K219 Gastro-esophageal reflux disease without esophagitis: Secondary | ICD-10-CM | POA: Diagnosis not present

## 2017-08-23 MED ORDER — ALPRAZOLAM 0.5 MG PO TABS: ORAL_TABLET | ORAL | 5 refills | Status: DC

## 2017-08-23 NOTE — Progress Notes (Signed)
Subjective:  Angel Ellis is a 62 y.o. year old very pleasant male patient who presents for/with See problem oriented charting ROS- No chest pain or shortness of breath. No headache or blurry vision.    Past Medical History-  Patient Active Problem List   Diagnosis Date Noted  . Gout 07/23/2014    Priority: Medium  . Essential hypertension 07/23/2014    Priority: Medium  . 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 Former smoker 06/29/2007    Priority: Medium  . Hyperlipidemia 02/15/2007    Priority: Medium  . Allergic rhinitis 07/23/2014    Priority: Low  . Rosacea 07/23/2014    Priority: Low  . Basal cell carcinoma of skin 01/07/2009    Priority: Low  . GERD 02/15/2007    Priority: Low  . Insomnia 08/23/2017    Medications- reviewed and updated Current Outpatient Medications  Medication Sig Dispense Refill  . ketotifen (ZADITOR) 0.025 % ophthalmic solution 1 drop 2 (two) times daily.    . saw palmetto 500 MG capsule Take 500 mg by mouth daily.    Marland Kitchen sulfacetamide (BLEPH-10) 10 % ophthalmic ointment every 6 (six) hours.    . ALPRAZolam (XANAX) 0.5 MG tablet TAKE 1 TABLET BY MOUTH ONCE DAILY AS NEEDED FOR SLEEP 30 tablet 5  . BYSTOLIC 5 MG tablet TAKE 1 TABLET BY MOUTH ONCE DAILY 90 tablet 3  . omeprazole (PRILOSEC) 20 MG capsule TAKE 1 CAPSULE BY MOUTH DAILY. 90 capsule 3   No current facility-administered medications for this visit.     Objective: BP 118/80 (BP Location: Left Arm, Patient Position: Sitting, Cuff Size: Normal)   Pulse 71   Temp 98.4 F (36.9 C) (Oral)   Ht 5' 10.08" (1.78 m)   Wt 174 lb 12.8 oz (79.3 kg)   SpO2 98%   BMI 25.02 kg/m  Gen: NAD, resting comfortably CV: RRR no murmurs rubs or gallops Lungs: CTAB no crackles, wheeze, rhonchi Abdomen: soft/nontender/nondistended/normal bowel sounds. Ext: no edema Skin: warm, dry  Assessment/Plan:  Hyperlipidemia S: has started flaxseed - trying to do natural treatments over crestor twice a  week. No side effects on statin A/P: will follow up with lipids at physical  Essential hypertension S: controlled on bystolic 5mg . 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 Continue current meds  GERD S:  remains on omeprazole 20mg . Failed zantac trial within recent years A/P: continue current medicine  Insomnia S: xanax 0.5mg  mainly for sleep with changes in work schedule or travel. Working well. Says refills #30 about every 3 months.  A/P: continue current medicines- refilled today. Could consider alternate beyond age 39- perhaps trazodone? Will reassess in future   Future Appointments  Date Time Provider [place]  11/04/2017 10:45 AM Marin Olp, MD [facility]  CPE and come fasting  Meds ordered this encounter  Medications  . ALPRAZolam (XANAX) 0.5 MG tablet    Sig: TAKE 1 TABLET BY MOUTH ONCE DAILY AS NEEDED FOR SLEEP    Dispense:  30 tablet    Refill:  5    Return precautions advised.  Garret Reddish, MD

## 2017-08-23 NOTE — Assessment & Plan Note (Signed)
S:  remains on omeprazole 20mg . Failed zantac trial within recent years A/P: continue current medicine

## 2017-08-23 NOTE — Assessment & Plan Note (Signed)
S: controlled on bystolic 5mg . 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 Continue current meds

## 2017-08-23 NOTE — Patient Instructions (Signed)
Schedule a physical in the next 3-6 months or before [employer] insurance requires  Keep up the great job with those steps! Blood pressure looks great. No changes medicine today.

## 2017-08-23 NOTE — Assessment & Plan Note (Signed)
S: has started flaxseed - trying to do natural treatments over crestor twice a week. No side effects on statin A/P: will follow up with lipids at physical

## 2017-08-23 NOTE — Assessment & Plan Note (Signed)
S: xanax 0.5mg  mainly for sleep with changes in work schedule or travel. Working well. Says refills #30 about every 3 months.  A/P: continue current medicines- refilled today. Could consider alternate beyond age 62- perhaps trazodone? Will reassess in future

## 2017-08-25 MED FILL — ALPRAZolam 0.5 MG TABS: 0.5 | 30 days supply | Qty: 30 | Fill #0

## 2017-08-26 MED FILL — TOBRAMYCIN-DEXAMETH OPHTH S: 0.3-0.1 | 13 days supply | Qty: 3 | Fill #0

## 2017-09-16 MED FILL — OMEPRAZOLE 20 MG CAP: 20 | 90 days supply | Qty: 90 | Fill #3

## 2017-10-10 DIAGNOSIS — M9901 Segmental and somatic dysfunction of cervical region: Secondary | ICD-10-CM | POA: Diagnosis not present

## 2017-10-10 DIAGNOSIS — M5441 Lumbago with sciatica, right side: Secondary | ICD-10-CM | POA: Diagnosis not present

## 2017-10-10 DIAGNOSIS — M9903 Segmental and somatic dysfunction of lumbar region: Secondary | ICD-10-CM | POA: Diagnosis not present

## 2017-10-11 DIAGNOSIS — L718 Other rosacea: Secondary | ICD-10-CM | POA: Diagnosis not present

## 2017-10-11 DIAGNOSIS — L309 Dermatitis, unspecified: Secondary | ICD-10-CM | POA: Diagnosis not present

## 2017-10-11 MED FILL — BETAMETHASONE DP 0.05% CRM: 0.05 | 7 days supply | Qty: 15 | Fill #0

## 2017-10-17 DIAGNOSIS — M9901 Segmental and somatic dysfunction of cervical region: Secondary | ICD-10-CM | POA: Diagnosis not present

## 2017-10-17 DIAGNOSIS — M5441 Lumbago with sciatica, right side: Secondary | ICD-10-CM | POA: Diagnosis not present

## 2017-10-17 DIAGNOSIS — M9903 Segmental and somatic dysfunction of lumbar region: Secondary | ICD-10-CM | POA: Diagnosis not present

## 2017-10-19 DIAGNOSIS — M9901 Segmental and somatic dysfunction of cervical region: Secondary | ICD-10-CM | POA: Diagnosis not present

## 2017-10-19 DIAGNOSIS — M5441 Lumbago with sciatica, right side: Secondary | ICD-10-CM | POA: Diagnosis not present

## 2017-10-19 DIAGNOSIS — M9903 Segmental and somatic dysfunction of lumbar region: Secondary | ICD-10-CM | POA: Diagnosis not present

## 2017-10-24 DIAGNOSIS — M5441 Lumbago with sciatica, right side: Secondary | ICD-10-CM | POA: Diagnosis not present

## 2017-10-24 DIAGNOSIS — M9903 Segmental and somatic dysfunction of lumbar region: Secondary | ICD-10-CM | POA: Diagnosis not present

## 2017-10-24 DIAGNOSIS — M9901 Segmental and somatic dysfunction of cervical region: Secondary | ICD-10-CM | POA: Diagnosis not present

## 2017-10-31 DIAGNOSIS — M5441 Lumbago with sciatica, right side: Secondary | ICD-10-CM | POA: Diagnosis not present

## 2017-10-31 DIAGNOSIS — M9903 Segmental and somatic dysfunction of lumbar region: Secondary | ICD-10-CM | POA: Diagnosis not present

## 2017-10-31 DIAGNOSIS — M9901 Segmental and somatic dysfunction of cervical region: Secondary | ICD-10-CM | POA: Diagnosis not present

## 2017-11-02 DIAGNOSIS — M9903 Segmental and somatic dysfunction of lumbar region: Secondary | ICD-10-CM | POA: Diagnosis not present

## 2017-11-02 DIAGNOSIS — M9901 Segmental and somatic dysfunction of cervical region: Secondary | ICD-10-CM | POA: Diagnosis not present

## 2017-11-02 DIAGNOSIS — M5441 Lumbago with sciatica, right side: Secondary | ICD-10-CM | POA: Diagnosis not present

## 2017-11-04 ENCOUNTER — Ambulatory Visit (INDEPENDENT_AMBULATORY_CARE_PROVIDER_SITE_OTHER): Payer: 59 | Admitting: Family Medicine

## 2017-11-04 ENCOUNTER — Encounter: Payer: Self-pay | Admitting: Family Medicine

## 2017-11-04 VITALS — BP 138/82 | HR 79 | Temp 98.0°F | Ht 70.08 in | Wt 172.8 lb

## 2017-11-04 DIAGNOSIS — M1009 Idiopathic gout, multiple sites: Secondary | ICD-10-CM

## 2017-11-04 DIAGNOSIS — Z0000 Encounter for general adult medical examination without abnormal findings: Secondary | ICD-10-CM

## 2017-11-04 DIAGNOSIS — Z87891 Personal history of nicotine dependence: Secondary | ICD-10-CM | POA: Diagnosis not present

## 2017-11-04 DIAGNOSIS — E785 Hyperlipidemia, unspecified: Secondary | ICD-10-CM | POA: Diagnosis not present

## 2017-11-04 DIAGNOSIS — I10 Essential (primary) hypertension: Secondary | ICD-10-CM | POA: Diagnosis not present

## 2017-11-04 DIAGNOSIS — Z125 Encounter for screening for malignant neoplasm of prostate: Secondary | ICD-10-CM

## 2017-11-04 DIAGNOSIS — K219 Gastro-esophageal reflux disease without esophagitis: Secondary | ICD-10-CM | POA: Diagnosis not present

## 2017-11-04 LAB — COMPREHENSIVE METABOLIC PANEL
ALBUMIN: 4.3 g/dL (ref 3.5–5.2)
ALT: 31 U/L (ref 0–53)
AST: 32 U/L (ref 0–37)
Alkaline Phosphatase: 57 U/L (ref 39–117)
BILIRUBIN TOTAL: 1 mg/dL (ref 0.2–1.2)
BUN: 13 mg/dL (ref 6–23)
CALCIUM: 9.3 mg/dL (ref 8.4–10.5)
CHLORIDE: 98 meq/L (ref 96–112)
CO2: 26 mEq/L (ref 19–32)
CREATININE: 0.82 mg/dL (ref 0.40–1.50)
GFR: 101.08 mL/min (ref >60.00)
Glucose, Bld: 87 mg/dL (ref 70–99)
Potassium: 4 mEq/L (ref 3.5–5.1)
Sodium: 135 mEq/L (ref 135–145)
Total Protein: 7.4 g/dL (ref 6.0–8.3)

## 2017-11-04 LAB — CBC
HEMATOCRIT: 41.6 % (ref 39.0–52.0)
HEMOGLOBIN: 14.4 g/dL (ref 13.0–17.0)
MCHC: 34.6 g/dL (ref 30.0–36.0)
MCV: 92.4 fl (ref 78.0–100.0)
Platelets: 205 10*3/uL (ref 150.0–400.0)
RBC: 4.51 Mil/uL (ref 4.22–5.81)
RDW: 12.6 % (ref 11.5–15.5)
WBC: 6.5 10*3/uL (ref 4.0–10.5)

## 2017-11-04 LAB — POC URINALSYSI DIPSTICK (AUTOMATED)
BILIRUBIN UA: NEGATIVE
Blood, UA: NEGATIVE
GLUCOSE UA: NEGATIVE
LEUKOCYTES UA: NEGATIVE
Nitrite, UA: NEGATIVE
PROTEIN UA: NEGATIVE
Spec Grav, UA: 1.01 (ref 1.010–1.025)
Urobilinogen, UA: 0.2 E.U./dL
pH, UA: 5.5 (ref 5.0–8.0)

## 2017-11-04 LAB — LIPID PANEL
CHOLESTEROL: 230 mg/dL — AB (ref 0–200)
HDL: 79.1 mg/dL (ref >39.00)
LDL CALC: 115 mg/dL — AB (ref 0–99)
NonHDL: 150.82
TRIGLYCERIDES: 179 mg/dL — AB (ref 0.0–149.0)
Total CHOL/HDL Ratio: 3
VLDL: 35.8 mg/dL (ref 0.0–40.0)

## 2017-11-04 LAB — URIC ACID: URIC ACID, SERUM: 8.1 mg/dL — AB (ref 4.0–7.8)

## 2017-11-04 LAB — PSA: PSA: 0.94 ng/mL (ref 0.10–4.00)

## 2017-11-04 NOTE — Addendum Note (Signed)
Addended by: Frutoso Chase A on: 11/04/2017 11:33 AM   Modules accepted: Orders

## 2017-11-04 NOTE — Progress Notes (Signed)
Phone: [phone]  Subjective:  Patient presents today for their annual physical. Chief complaint-noted.   See problem oriented charting- ROS- full  review of systems was completed and negative except for: PND, sneezing, joint pain, seasonal allergies  The following were reviewed and entered/updated in epic: Past Medical History:  Diagnosis Date  . Diverticulitis of colon 10/29/2009   After august 1995    . Diverticulosis   . GERD (gastroesophageal reflux disease)   . Hypertension   . Lipoma of neck    L neck-remains in place  . Nasal congestion   . Sebaceous cyst 01/07/2009   R axilla     Patient Active Problem List   Diagnosis Date Noted  . Gout 07/23/2014    Priority: Medium  . Essential hypertension 07/23/2014    Priority: Medium  . 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 Former smoker 06/29/2007    Priority: Medium  . Hyperlipidemia 02/15/2007    Priority: Medium  . Allergic rhinitis 07/23/2014    Priority: Low  . Rosacea 07/23/2014    Priority: Low  . Basal cell carcinoma of skin 01/07/2009    Priority: Low  . GERD 02/15/2007    Priority: Low  . Insomnia 08/23/2017   Past Surgical History:  Procedure Laterality Date  . none      Family History  Problem Relation Age of Onset  . Heart disease Mother        stopped psychiatric meds and BP meds, later with MI  . Hypertension Mother   . Schizophrenia Mother   . Heart disease Father        sleep apnea not controlled. 74.     Medications- reviewed and updated Current Outpatient Medications  Medication Sig Dispense Refill  . ALPRAZolam (XANAX) 0.5 MG tablet TAKE 1 TABLET BY MOUTH ONCE DAILY AS NEEDED FOR SLEEP 30 tablet 5  . BYSTOLIC 5 MG tablet TAKE 1 TABLET BY MOUTH ONCE DAILY 90 tablet 3  . omeprazole (PRILOSEC) 20 MG capsule TAKE 1 CAPSULE BY MOUTH DAILY. 90 capsule 3  . saw palmetto 500 MG capsule Take 500 mg by mouth daily.    Marland Kitchen sulfacetamide (BLEPH-10) 10 % ophthalmic ointment every 6  (six) hours.     No current facility-administered medications for this visit.     Allergies-reviewed and updated Allergies  Allergen Reactions  . Aspirin Other (See Comments)    Abdominal cramping, gi discomfort  . Milk-Related Compounds     GI Symptoms  . Penicillins Palpitations    Makes heart race    Social History   Social History Narrative   Domestic partner, not sexually active [name] also a patient here). No kids. Shares home with a friend-bought together.       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     Objective: BP 138/82 (BP Location: Left Arm, Patient Position: Sitting, Cuff Size: Normal)   Pulse 79   Temp 98 F (36.7 C) (Oral)   Ht 5' 10.08" (1.78 m)   Wt 172 lb 12.8 oz (78.4 kg)   SpO2 96%   BMI 24.74 kg/m  Gen: NAD, resting comfortably HEENT: Mucous membranes are moist. Oropharynx normal Neck: no thyromegaly CV: RRR no murmurs rubs or gallops Lungs: CTAB no crackles, wheeze, rhonchi Abdomen: soft/nontender/nondistended/normal bowel sounds. No rebound or guarding.  Ext: no edema Skin: warm, dry Neuro: grossly normal, moves all extremities, PERRLA Rectal: normal tone, normal sized prostate,  no masses or tenderness  Assessment/Plan:  62 y.o. male presenting for annual physical.  Health Maintenance counseling: 1. Anticipatory guidance: Patient counseled regarding regular dental exams -q6 months, eye exams -yearly at least- predisposition to infections so has more frequent visits, wearing seatbelts.  2. Risk factor reduction:  Advised patient of need for regular exercise and diet rich and fruits and vegetables to reduce risk of heart attack and stroke. Exercise- 10-11 miles a day walking on his job. Diet-reasonably healthy diet- does more beef than he hsould he says.  Wt Readings from Last 3 Encounters:  11/04/17 172 lb 12.8 oz (78.4 kg)  08/23/17 174 lb 12.8 oz (79.3 kg)  03/01/16 176 lb 12.8 oz  (80.2 kg)  3. Immunizations/screenings/ancillary studies- shingrix will consider in the future Immunization History  Administered Date(s) Administered  . Influenza,inj,Quad PF,6+ Mos 01/08/2013  . Influenza-Unspecified 02/23/2015  . Td 08/08/2005, 07/07/2016  . Tdap 10/03/2015  4. Prostate cancer screening-  low risk prior psa trend- will update. Low risk rectal. He is using saw palmetto - nocturia once a night but keeps water bottle at bedside Lab Results  Component Value Date   PSA 0.76 09/26/2015   PSA 0.66 03/14/2013   PSA 0.76 11/26/2011   5. Colon cancer screening -  01/30/09 with 10 year repeat.  6. Skin cancer screening- sees Dr. Wilhemina Bonito yearly. advised regular sunscreen use. Denies worrisome, changing, or new skin lesions.  7. Former smoker- quit 2010. AAA planned 61. Lung cancer screening - opts in. UA today  Status of chronic or acute concerns   gerd- failed zantac trial in past- back on prilosec 20mg   HTN- controlled today and hasnt even taken bystolic yet  PVCs in past- does better on bystolic  Gout- no recent flare ups. Has colcrys on hand if neede.d update uric acid. Years since he has had issues   HLD- has been on crestor in past- he decided to come off on his own. We discussed today and opted to use 12% cut off- he continues to do a great job with exercise and healthy eating which lowers his risk- though he does states could cut back on the red meats  Insomnia- xanax prn  Return in about 1 year (around 11/05/2018) for physical.  Lab/Order associations: Preventative health care - Plan: CBC, Comprehensive metabolic panel, Lipid panel, PSA, POCT Urinalysis Dipstick (Automated), Uric acid, Ambulatory Referral for Lung Cancer Scre  Screening for prostate cancer - Plan: PSA  Hyperlipidemia, unspecified hyperlipidemia type - Plan: CBC, Comprehensive metabolic panel, Lipid panel, POCT Urinalysis Dipstick (Automated)  Gastroesophageal reflux disease without  esophagitis  Essential hypertension - Plan: CBC, Comprehensive metabolic panel, Lipid panel, POCT Urinalysis Dipstick (Automated)  Acute idiopathic gout of multiple sites - Plan: Uric acid  Former smoker - Plan: Ambulatory Referral for Lung Cancer Scre  Return precautions advised.  Garret Reddish, MD

## 2017-11-04 NOTE — Patient Instructions (Addendum)
Please stop by the lab before you go.  We will call you within two weeks about your referral to lung cancer screening. If you do not hear within 3 weeks, give [place] a call.   Go ahead and schedule your physical for 1 year. Happy to see you in 6 months as well for blood pressure check.   Great job with the regular walking and slow steady weight loss.   If 10 year risk of heart attack or stroke based on cholesterol above 12%- will plan on starting medication

## 2017-11-07 ENCOUNTER — Other Ambulatory Visit: Payer: Self-pay | Admitting: Acute Care

## 2017-11-07 DIAGNOSIS — Z87891 Personal history of nicotine dependence: Secondary | ICD-10-CM

## 2017-11-07 DIAGNOSIS — Z122 Encounter for screening for malignant neoplasm of respiratory organs: Secondary | ICD-10-CM

## 2017-11-11 MED FILL — ALPRAZolam 0.5 MG TABS: 0.5 | 30 days supply | Qty: 30 | Fill #1

## 2017-11-14 DIAGNOSIS — M9903 Segmental and somatic dysfunction of lumbar region: Secondary | ICD-10-CM | POA: Diagnosis not present

## 2017-11-14 DIAGNOSIS — M9901 Segmental and somatic dysfunction of cervical region: Secondary | ICD-10-CM | POA: Diagnosis not present

## 2017-11-14 DIAGNOSIS — M5441 Lumbago with sciatica, right side: Secondary | ICD-10-CM | POA: Diagnosis not present

## 2017-11-15 ENCOUNTER — Other Ambulatory Visit: Payer: Self-pay | Admitting: Family Medicine

## 2017-11-15 MED FILL — BYSTOLIC 5 MG TABLET: 5 | 90 days supply | Qty: 90 | Fill #0

## 2017-11-21 ENCOUNTER — Ambulatory Visit (INDEPENDENT_AMBULATORY_CARE_PROVIDER_SITE_OTHER): Payer: 59 | Admitting: Acute Care

## 2017-11-21 ENCOUNTER — Ambulatory Visit (INDEPENDENT_AMBULATORY_CARE_PROVIDER_SITE_OTHER)
Admission: RE | Admit: 2017-11-21 | Discharge: 2017-11-21 | Disposition: A | Discharge status: Home / Self Care | Payer: 59 | Source: Ambulatory Visit | Attending: Acute Care | Admitting: Acute Care

## 2017-11-21 ENCOUNTER — Encounter: Payer: Self-pay | Admitting: Acute Care

## 2017-11-21 DIAGNOSIS — Z87891 Personal history of nicotine dependence: Secondary | ICD-10-CM

## 2017-11-21 DIAGNOSIS — Z122 Encounter for screening for malignant neoplasm of respiratory organs: Secondary | ICD-10-CM

## 2017-11-21 NOTE — Progress Notes (Signed)
Shared Decision Making Visit Lung Cancer Screening Program [phone])   Eligibility:  Age 62 y.o.  Pack Years Smoking History Calculation 37 pack year smoking history (# packs/per year x # years smoked)  Recent History of coughing up blood  no  Unexplained weight loss? no ( >Than 15 pounds within the last 6 months )  Prior History Lung / other cancer no (Diagnosis within the last 5 years already requiring surveillance chest CT Scans).  Smoking Status Former Smoker  Former Smokers: Years since quit: 7 years  Quit Date: 01/2010  Visit Components:  Discussion included one or more decision making aids. yes  Discussion included risk/benefits of screening. yes  Discussion included potential follow up diagnostic testing for abnormal scans. yes  Discussion included meaning and risk of over diagnosis. yes  Discussion included meaning and risk of False Positives. yes  Discussion included meaning of total radiation exposure. yes  Counseling Included:  Importance of adherence to annual lung cancer LDCT screening. yes  Impact of comorbidities on ability to participate in the program. yes  Ability and willingness to under diagnostic treatment. yes  Smoking Cessation Counseling:  Current Smokers:   Discussed importance of smoking cessation. NA former smoker  Information about tobacco cessation classes and interventions provided to patient. yes  Patient provided with "ticket" for LDCT Scan. yes  Symptomatic Patient. no  Counseling  Diagnosis Code: NA  Asymptomatic Patient yes  Counseling (Intermediate counseling: > three minutes counseling) [id number]  Former Smokers:   Discussed the importance of maintaining cigarette abstinence. yes  Diagnosis Code: Personal History of Nicotine Dependence. E75.170  Information about tobacco cessation classes and interventions provided to patient. NA  Patient provided with "ticket" for LDCT Scan. yes  Written Order for Lung Cancer  Screening with LDCT placed in Epic. Yes (CT Chest Lung Cancer Screening Low Dose W/O CM) [id number] Z12.2-Screening of respiratory organs Z87.891-Personal history of nicotine dependence  I spent 25 minutes of face to face time with Mr. Visconti discussing the risks and benefits of lung cancer screening. We viewed a power point together that explained in detail the above noted topics. We took the time to pause the power point at intervals to allow for questions to be asked and answered to ensure understanding. We discussed that he had taken the single most powerful action possible to decrease he risk of developing lung cancer when he quit smoking. I counseled him to remain smoke free, and to contact me if he ever had the desire to smoke again so that I can provide resources and tools to help support the effort to remain smoke free. We discussed the time and location of the scan, and that either  Doroteo Glassman RN or I will call with the results within  24-48 hours of receiving them. He has my card and contact information in the event he needs to speak with me, in addition to a copy of the power point we reviewed as a resource. He verbalized understanding of all of the above and had no further questions upon leaving the office.     I explained to the patient that there has been a high incidence of coronary artery disease noted on these exams. I explained that this is a non-gated exam therefore degree or severity cannot be determined. This patient is not on statin therapy. I have asked the patient to follow-up with their PCP regarding any incidental finding of coronary artery disease and management with diet or medication as they feel is clinically  indicated. The patient verbalized understanding of the above and had no further questions.     Magdalen Spatz, NP 11/21/2017 3:37 PM

## 2017-11-24 ENCOUNTER — Other Ambulatory Visit: Payer: Self-pay | Admitting: Acute Care

## 2017-11-24 DIAGNOSIS — Z87891 Personal history of nicotine dependence: Secondary | ICD-10-CM

## 2017-11-24 DIAGNOSIS — Z122 Encounter for screening for malignant neoplasm of respiratory organs: Secondary | ICD-10-CM

## 2017-11-24 NOTE — Progress Notes (Signed)
Team - please call patient. He has plaque on his coronary arteries per CT. I would like for him to start atorvastatin 10mg  daily to lower his risk of long term heart attack or stroke as a result. Please send in #90 with 3 refills if he is willing. If he declines- please let me know

## 2017-11-25 ENCOUNTER — Telehealth: Payer: Self-pay

## 2017-11-25 DIAGNOSIS — H524 Presbyopia: Secondary | ICD-10-CM | POA: Diagnosis not present

## 2017-11-25 MED ORDER — ATORVASTATIN CALCIUM 10 MG PO TABS: 10.0000 mg | ORAL_TABLET | Freq: Every day | ORAL | 3 refills | Status: DC

## 2017-11-25 MED FILL — ATORVASTATIN 10 MG TABLET: 10 | 90 days supply | Qty: 90 | Fill #0

## 2017-11-25 NOTE — Telephone Encounter (Signed)
-----   Message from Marin Olp, MD sent at 11/24/2017  8:39 PM EDT -----   ----- Message ----- From: Christie Beckers, RN Sent: 11/24/2017   9:17 AM To: Marin Olp, MD

## 2017-11-25 NOTE — Telephone Encounter (Signed)
Spoke with patient who verbalized understanding. I did send in medication as requested to the pharmacy.

## 2017-11-25 NOTE — Telephone Encounter (Signed)
Called and left a voicemail message asking for a return phone call 

## 2017-11-30 DIAGNOSIS — M5441 Lumbago with sciatica, right side: Secondary | ICD-10-CM | POA: Diagnosis not present

## 2017-11-30 DIAGNOSIS — M9901 Segmental and somatic dysfunction of cervical region: Secondary | ICD-10-CM | POA: Diagnosis not present

## 2017-11-30 DIAGNOSIS — M9903 Segmental and somatic dysfunction of lumbar region: Secondary | ICD-10-CM | POA: Diagnosis not present

## 2017-11-30 MED FILL — TOBRAMYCIN-DEXAMETH OPHTH S: 0.3-0.1 | 13 days supply | Qty: 3 | Fill #0

## 2017-12-13 MED FILL — OLOPATADINE HCL 0.2% EYE DR: 0.2 | 25 days supply | Qty: 3 | Fill #0

## 2017-12-14 DIAGNOSIS — M9901 Segmental and somatic dysfunction of cervical region: Secondary | ICD-10-CM | POA: Diagnosis not present

## 2017-12-14 DIAGNOSIS — M5441 Lumbago with sciatica, right side: Secondary | ICD-10-CM | POA: Diagnosis not present

## 2017-12-14 DIAGNOSIS — M9903 Segmental and somatic dysfunction of lumbar region: Secondary | ICD-10-CM | POA: Diagnosis not present

## 2017-12-19 ENCOUNTER — Other Ambulatory Visit: Payer: Self-pay | Admitting: Family Medicine

## 2017-12-19 MED FILL — OMEPRAZOLE 20 MG CAP: 20 | 90 days supply | Qty: 90 | Fill #0

## 2017-12-21 DIAGNOSIS — M5441 Lumbago with sciatica, right side: Secondary | ICD-10-CM | POA: Diagnosis not present

## 2017-12-21 DIAGNOSIS — M9903 Segmental and somatic dysfunction of lumbar region: Secondary | ICD-10-CM | POA: Diagnosis not present

## 2017-12-21 DIAGNOSIS — M9901 Segmental and somatic dysfunction of cervical region: Secondary | ICD-10-CM | POA: Diagnosis not present

## 2017-12-28 DIAGNOSIS — M9901 Segmental and somatic dysfunction of cervical region: Secondary | ICD-10-CM | POA: Diagnosis not present

## 2017-12-28 DIAGNOSIS — M9903 Segmental and somatic dysfunction of lumbar region: Secondary | ICD-10-CM | POA: Diagnosis not present

## 2017-12-28 DIAGNOSIS — M5441 Lumbago with sciatica, right side: Secondary | ICD-10-CM | POA: Diagnosis not present

## 2018-01-04 DIAGNOSIS — M9903 Segmental and somatic dysfunction of lumbar region: Secondary | ICD-10-CM | POA: Diagnosis not present

## 2018-01-04 DIAGNOSIS — M5441 Lumbago with sciatica, right side: Secondary | ICD-10-CM | POA: Diagnosis not present

## 2018-01-04 DIAGNOSIS — M9901 Segmental and somatic dysfunction of cervical region: Secondary | ICD-10-CM | POA: Diagnosis not present

## 2018-01-11 DIAGNOSIS — M9903 Segmental and somatic dysfunction of lumbar region: Secondary | ICD-10-CM | POA: Diagnosis not present

## 2018-01-11 DIAGNOSIS — M9901 Segmental and somatic dysfunction of cervical region: Secondary | ICD-10-CM | POA: Diagnosis not present

## 2018-01-11 DIAGNOSIS — M5441 Lumbago with sciatica, right side: Secondary | ICD-10-CM | POA: Diagnosis not present

## 2018-02-01 MED FILL — ALPRAZolam 0.5 MG TABS: 0.5 | 30 days supply | Qty: 30 | Fill #2

## 2018-02-06 ENCOUNTER — Ambulatory Visit: Payer: 59 | Admitting: Physician Assistant

## 2018-02-06 ENCOUNTER — Encounter: Payer: Self-pay | Admitting: Physician Assistant

## 2018-02-06 VITALS — BP 134/84 | HR 89 | Temp 99.9°F | Ht 70.0 in | Wt 171.6 lb

## 2018-02-06 DIAGNOSIS — J0100 Acute maxillary sinusitis, unspecified: Secondary | ICD-10-CM

## 2018-02-06 MED ORDER — DOXYCYCLINE HYCLATE 100 MG PO TABS: 100.0000 mg | ORAL_TABLET | Freq: Two times a day (BID) | ORAL | 0 refills | Status: DC

## 2018-02-06 MED FILL — DOXYCYCLINE HYCLATE 100 MG: 100 | 10 days supply | Qty: 20 | Fill #0

## 2018-02-06 NOTE — Patient Instructions (Signed)
It was great to see you!  Use medication as prescribed: Doxycycline  Push fluids and get plenty of rest. Please return if you are not improving as expected, or if you have high fevers (>101.5) or difficulty swallowing or worsening productive cough.  Call clinic with questions.  I hope you start feeling better soon!

## 2018-02-06 NOTE — Progress Notes (Signed)
Angel Ellis is a 62 y.o. male here for a new problem.   History of Present Illness:   Chief Complaint  Patient presents with  . Acute Visit    HPI   Patient reports that his symptoms started on Wednesday of last week (approx 5-6 days ago.) His partner has similar symptoms. He endorses congestion, sinus pressure, cough, HA. He has tried tylenol and sinus rinse without relief. Low grade temp of 99.9 today. Having some lower back aches as well.  Former smoker, not exposed to smoke on regular basis.  Pushing fluids. Denies CP, SOB.   Past Medical History:  Diagnosis Date  . Diverticulitis of colon 10/29/2009   After august 1995    . Diverticulosis   . GERD (gastroesophageal reflux disease)   . Hypertension   . Lipoma of neck    L neck-remains in place  . Nasal congestion   . Sebaceous cyst 01/07/2009   R axilla       Social History   Socioeconomic History  . Marital status: Single    Spouse name: Not on file  . Number of children: Not on file  . Years of education: Not on file  . Highest education level: Not on file  Occupational History  . Not on file  Social Needs  . Financial resource strain: Not on file  . Food insecurity:    Worry: Not on file    Inability: Not on file  . Transportation needs:    Medical: Not on file    Non-medical: Not on file  Tobacco Use  . Smoking status: Former Smoker    Packs/day: 1.00    Years: 37.00    Pack years: 37.00    Types: Cigarettes    Last attempt to quit: 12/08/2009    Years since quitting: 8.1  . Smokeless tobacco: Never Used  Substance and Sexual Activity  . Alcohol use: Yes    Alcohol/week: 14.0 standard drinks    Types: 14 Standard drinks or equivalent per week    Comment: one per day  . Drug use: No  . Sexual activity: Never  Lifestyle  . Physical activity:    Days per week: Not on file    Minutes per session: Not on file  . Stress: Not on file  Relationships  . Social connections:    Talks on phone: Not  on file    Gets together: Not on file    Attends religious service: Not on file    Active member of club or organization: Not on file    Attends meetings of clubs or organizations: Not on file    Relationship status: Not on file  . Intimate partner violence:    Fear of current or ex partner: Not on file    Emotionally abused: Not on file    Physically abused: Not on file    Forced sexual activity: Not on file  Other Topics Concern  . Not on file  Social History Narrative   Domestic partner, not sexually active Angel Ellis also a patient here). No kids. Shares home with a friend-bought together.       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     Past Surgical History:  Procedure Laterality Date  . none      Family History  Problem Relation Age of Onset  . Heart disease Mother        stopped psychiatric meds and BP  meds, later with MI  . Hypertension Mother   . Schizophrenia Mother   . Heart disease Father        sleep apnea not controlled. 74.     Allergies  Allergen Reactions  . Aspirin Other (See Comments)    Abdominal cramping, gi discomfort  . Milk-Related Compounds     GI Symptoms  . Penicillins Palpitations    Makes heart race    Current Medications:   Current Outpatient Medications:  .  ALPRAZolam (XANAX) 0.5 MG tablet, TAKE 1 TABLET BY MOUTH ONCE DAILY AS NEEDED FOR SLEEP, Disp: 30 tablet, Rfl: 5 .  BYSTOLIC 5 MG tablet, TAKE 1 TABLET BY MOUTH ONCE DAILY, Disp: 90 tablet, Rfl: 3 .  omeprazole (PRILOSEC) 20 MG capsule, TAKE 1 CAPSULE BY MOUTH DAILY., Disp: 90 capsule, Rfl: 3 .  saw palmetto 500 MG capsule, Take 500 mg by mouth daily., Disp: , Rfl:  .  sulfacetamide (BLEPH-10) 10 % ophthalmic ointment, every 6 (six) hours., Disp: , Rfl:  .  atorvastatin (LIPITOR) 10 MG tablet, Take 1 tablet (10 mg total) by mouth daily. (Patient not taking: Reported on 02/06/2018), Disp: 90 tablet, Rfl: 3 .  doxycycline  (VIBRA-TABS) 100 MG tablet, Take 1 tablet (100 mg total) by mouth 2 (two) times daily., Disp: 20 tablet, Rfl: 0   Review of Systems:   ROS  Negative unless otherwise specified per HPI.  Vitals:   Vitals:   02/06/18 0838  BP: 134/84  Pulse: 89  Temp: 99.9 F (37.7 C)  TempSrc: Oral  SpO2: 99%  Weight: 171 lb 9.6 oz (77.8 kg)  Height: 5\' 10"  (1.778 m)     Body mass index is 24.62 kg/m.  Physical Exam:   Physical Exam  Constitutional: He appears well-developed. He is cooperative.  Non-toxic appearance. He does not have a sickly appearance. He does not appear ill. No distress.  HENT:  Head: Normocephalic and atraumatic.  Right Ear: Tympanic membrane, external ear and ear canal normal. Tympanic membrane is not erythematous, not retracted and not bulging.  Left Ear: Tympanic membrane, external ear and ear canal normal. Tympanic membrane is not erythematous, not retracted and not bulging.  Nose: Mucosal edema and rhinorrhea present. Right sinus exhibits maxillary sinus tenderness. Right sinus exhibits no frontal sinus tenderness. Left sinus exhibits maxillary sinus tenderness. Left sinus exhibits no frontal sinus tenderness.  Mouth/Throat: Uvula is midline and mucous membranes are normal. Posterior oropharyngeal erythema present. No posterior oropharyngeal edema. Tonsils are 1+ on the right. Tonsils are 1+ on the left.  Eyes: Conjunctivae and lids are normal.  Neck: Trachea normal.  Cardiovascular: Normal rate, regular rhythm, S1 normal, S2 normal and normal heart sounds.  Pulmonary/Chest: Effort normal and breath sounds normal. He has no decreased breath sounds. He has no wheezes. He has no rhonchi. He has no rales.  Lymphadenopathy:    He has no cervical adenopathy.  Neurological: He is alert.  Skin: Skin is warm, dry and intact.  Psychiatric: He has a normal mood and affect. His speech is normal and behavior is normal.  Nursing note and vitals reviewed.   Assessment and Plan:     Angel Ellis was seen today for acute visit.  Diagnoses and all orders for this visit:  Acute non-recurrent maxillary sinusitis  Other orders -     doxycycline (VIBRA-TABS) 100 MG tablet; Take 1 tablet (100 mg total) by mouth 2 (two) times daily.   No red flags on exam.  Will initiate Doxycycline per  orders. Discussed taking medications as prescribed. Reviewed return precautions including worsening fever, SOB, worsening cough or other concerns. Push fluids and rest. I recommend that patient follow-up if symptoms worsen or persist despite treatment x 7-10 days, sooner if needed.  . Reviewed expectations re: course of current medical issues. . Discussed self-management of symptoms. . Outlined signs and symptoms indicating need for more acute intervention. . Patient verbalized understanding and all questions were answered. . See orders for this visit as documented in the electronic medical record. . Patient received an After-Visit Summary.  CMA or LPN served as scribe during this visit. History, Physical, and Plan performed by medical provider. The above documentation has been reviewed and is accurate and complete.  Inda Coke, PA-C

## 2018-02-14 MED FILL — BYSTOLIC 5 MG TABLET: 5 | 90 days supply | Qty: 90 | Fill #1

## 2018-03-20 ENCOUNTER — Other Ambulatory Visit: Payer: Self-pay | Admitting: Family Medicine

## 2018-03-20 MED FILL — OMEPRAZOLE 20 MG CPDR: 20 | 90 days supply | Qty: 90 | Fill #1

## 2018-03-20 MED FILL — ALPRAZolam 0.5 MG TABS: 0.5 | 30 days supply | Qty: 30 | Fill #0

## 2018-03-20 MED FILL — TOBRAMYCIN-DEXAMETH OPHTH S: 0.3-0.1 | 13 days supply | Qty: 3 | Fill #0

## 2018-04-20 ENCOUNTER — Encounter (HOSPITAL_COMMUNITY): Payer: Self-pay

## 2018-04-20 ENCOUNTER — Ambulatory Visit (HOSPITAL_COMMUNITY)
Admission: EM | Admit: 2018-04-20 | Discharge: 2018-04-20 | Disposition: A | Discharge status: Home / Self Care | Payer: 59 | Attending: Family Medicine | Admitting: Family Medicine

## 2018-04-20 ENCOUNTER — Emergency Department (HOSPITAL_COMMUNITY): Admission: EM | Admit: 2018-04-20 | Discharge: 2018-04-20 | Discharge status: Absent without leave | Payer: 59

## 2018-04-20 DIAGNOSIS — M722 Plantar fascial fibromatosis: Secondary | ICD-10-CM | POA: Insufficient documentation

## 2018-04-20 MED ORDER — DICLOFENAC SODIUM 75 MG PO TBEC: 75.0000 mg | DELAYED_RELEASE_TABLET | Freq: Two times a day (BID) | ORAL | 0 refills | Status: DC

## 2018-04-20 MED FILL — DICLOFENAC SODIUM 75 MG TAB: 75 | 7 days supply | Qty: 14 | Fill #0

## 2018-04-20 NOTE — ED Triage Notes (Signed)
Pt present pain in both of his feet. That started 2 days ago.

## 2018-04-20 NOTE — ED Provider Notes (Signed)
[facility]   [id number] 04/20/18 Arrival Time: 1106  ASSESSMENT & PLAN:  1. Plantar fasciitis, bilateral   H/O similar in the past.  Meds ordered this encounter  Medications  . diclofenac (VOLTAREN) 75 MG EC tablet    Sig: Take 1 tablet (75 mg total) by mouth 2 (two) times daily.    Dispense:  14 tablet    Refill:  0   WBAT. Work note given.  Follow-up Information    Marin Olp, MD.   Specialty:  Family Medicine Why:  If symptoms worsen. Contact information: [place] [address] [phone]          May f/u here as needed.  Reviewed expectations re: course of current medical issues. Questions answered. Outlined signs and symptoms indicating need for more acute intervention. Patient verbalized understanding. After Visit Summary given.  SUBJECTIVE: History from: patient. Angel Ellis is a 62 y.o. male who reports fairly persistent moderate pain of his bilateral plantar feet (initally R foot about 2 days ago; noticed same symptoms today of L foot); described as aching without radiation. Worse when he wakes. A little better after he walks a bit. 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 Reports that he walks 10-12 miles per day. No foot trauma. Symptoms have progressed to a point and plateaued since beginning. Aggravating factors: prolonged standing/walking. Alleviating factors: rest. Associated symptoms: none reported. Extremity sensation changes or weakness: none. Self treatment: has not tried OTCs for relief of pain. History of similar: yes.  Past Surgical History:  Procedure Laterality Date  . none       ROS: As per HPI.   OBJECTIVE:  Vitals:   04/20/18 1134  BP: (!) 145/88  Pulse: 71  Resp: 18  Temp: 97.8 F (36.6 C)  TempSrc: Oral  SpO2: 100%    General appearance: alert; no distress Extremities: . RLE: warm and well perfused; well localized moderate tenderness over right plantar foot, more  towards heel; without gross deformities; with no swelling; with no bruising; ROM: normal . LLE: warm and well perfused; fairly well localized mild tenderness over left plantar foot; without gross deformities; with no swelling; with no bruising; ROM: normal CV: brisk extremity capillary refill of RLE and LLE Skin: warm and dry; no visible rashes Neurologic: gait normal; normal sensation of RLE and LLE; normal strength of RLE and LLE Psychological: alert and cooperative; normal mood and affect  Allergies  Allergen Reactions  . Aspirin Other (See Comments)    Abdominal cramping, gi discomfort  . Milk-Related Compounds     GI Symptoms  . Penicillins Palpitations    Makes heart race    Past Medical History:  Diagnosis Date  . Diverticulitis of colon 10/29/2009   After august 1995    . Diverticulosis   . GERD (gastroesophageal reflux disease)   . Hypertension   . Lipoma of neck    L neck-remains in place  . Nasal congestion   . Sebaceous cyst 01/07/2009   R axilla     Social History   Socioeconomic History  . Marital status: Single    Spouse name: Not on file  . Number of children: Not on file  . Years of education: Not on file  . Highest education level: Not on file  Occupational History  . Not on file  Social Needs  . Financial resource strain: Not on file  . Food insecurity:    Worry: Not on file  Inability: Not on file  . Transportation needs:    Medical: Not on file    Non-medical: Not on file  Tobacco Use  . Smoking status: Former Smoker    Packs/day: 1.00    Years: 37.00    Pack years: 37.00    Types: Cigarettes    Last attempt to quit: 12/08/2009    Years since quitting: 8.3  . Smokeless tobacco: Never Used  Substance and Sexual Activity  . Alcohol use: Yes    Alcohol/week: 14.0 standard drinks    Types: 14 Standard drinks or equivalent per week    Comment: one per day  . Drug use: No  . Sexual activity: Never  Lifestyle  . Physical activity:    Days  per week: Not on file    Minutes per session: Not on file  . Stress: Not on file  Relationships  . Social connections:    Talks on phone: Not on file    Gets together: Not on file    Attends religious service: Not on file    Active member of club or organization: Not on file    Attends meetings of clubs or organizations: Not on file    Relationship status: Not on file  Other Topics Concern  . Not on file  Social History Narrative   Domestic partner, not sexually active [name] also a patient here). No kids. Shares home with a friend-bought together.       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    Family History  Problem Relation Age of Onset  . Heart disease Mother        stopped psychiatric meds and BP meds, later with MI  . Hypertension Mother   . Schizophrenia Mother   . Heart disease Father        sleep apnea not controlled. 74.    Past Surgical History:  Procedure Laterality Date  . none        Vanessa Kick, MD 04/20/18 1152

## 2018-05-15 MED FILL — BYSTOLIC 5 MG TABLET: 5 | 90 days supply | Qty: 90 | Fill #2

## 2018-05-26 ENCOUNTER — Encounter: Payer: Self-pay | Admitting: Physician Assistant

## 2018-05-26 ENCOUNTER — Ambulatory Visit: Payer: 59 | Admitting: Physician Assistant

## 2018-05-26 VITALS — BP 140/90 | HR 73 | Temp 98.6°F | Ht 70.0 in | Wt 172.5 lb

## 2018-05-26 DIAGNOSIS — J0100 Acute maxillary sinusitis, unspecified: Secondary | ICD-10-CM | POA: Diagnosis not present

## 2018-05-26 MED ORDER — DOXYCYCLINE HYCLATE 100 MG PO TABS: 100.0000 mg | ORAL_TABLET | Freq: Two times a day (BID) | ORAL | 0 refills | Status: DC

## 2018-05-26 MED ORDER — FLUTICASONE PROPIONATE 50 MCG/ACT NA SUSP: 2.0000 | Freq: Every day | NASAL | 2 refills | Status: DC

## 2018-05-26 MED FILL — FLUTICASONE PROP 50 MCG SPR: 50 | 30 days supply | Qty: 16 | Fill #0

## 2018-05-26 MED FILL — DOXYCYCLINE HYCLATE 100 MG: 100 | 10 days supply | Qty: 20 | Fill #0

## 2018-05-26 NOTE — Patient Instructions (Signed)
It was great to see you!  You have a viral upper respiratory infection. Antibiotics are not needed for this.  Viral infections usually take 7-10 days to resolve.  If you feel like things are not improving sooner or if they worsen, start th oral doxycycline antibiotic (I have already sent this in.)  Use medication as prescribed: Flonase and plain mucinex   Push fluids and get plenty of rest. Please return if you are not improving as expected, or if you have high fevers (>101.5) or difficulty swallowing or worsening productive cough.  Call clinic with questions.  I hope you start feeling better soon!

## 2018-05-26 NOTE — Progress Notes (Signed)
Angel Ellis is a 63 y.o. male here for a new problem.  I acted as a Education administrator for [employer], PA-C Anselmo Pickler, LPN  History of Present Illness:   Chief Complaint  Patient presents with  . Sore Throat    Sore Throat   This is a new problem. Episode onset: Started 3 days ago. The problem has been gradually worsening. Neither side of throat is experiencing more pain than the other. Associated symptoms include congestion (Nasal, yellow drainage) and a hoarse voice. Pertinent negatives include no diarrhea, ear discharge, ear pain, headaches, shortness of breath, swollen glands or trouble swallowing. Associated symptoms comments: Post nasal drip, sneezing, scratchy throat from drainage, otherwise has improved.. He has tried gargles (Alka-seltzer plus Wed) for the symptoms. The treatment provided moderate relief.    Past Medical History:  Diagnosis Date  . Diverticulitis of colon 10/29/2009   After august 1995    . Diverticulosis   . GERD (gastroesophageal reflux disease)   . Hypertension   . Lipoma of neck    L neck-remains in place  . Nasal congestion   . Sebaceous cyst 01/07/2009   R axilla       Social History   Socioeconomic History  . Marital status: Single    Spouse name: Not on file  . Number of children: Not on file  . Years of education: Not on file  . Highest education level: Not on file  Occupational History  . Not on file  Social Needs  . Financial resource strain: Not on file  . Food insecurity:    Worry: Not on file    Inability: Not on file  . Transportation needs:    Medical: Not on file    Non-medical: Not on file  Tobacco Use  . Smoking status: Former Smoker    Packs/day: 1.00    Years: 37.00    Pack years: 37.00    Types: Cigarettes    Last attempt to quit: 12/08/2009    Years since quitting: 8.4  . Smokeless tobacco: Never Used  Substance and Sexual Activity  . Alcohol use: Yes    Alcohol/week: 14.0 standard drinks    Types: 14 Standard  drinks or equivalent per week    Comment: one per day  . Drug use: No  . Sexual activity: Never  Lifestyle  . Physical activity:    Days per week: Not on file    Minutes per session: Not on file  . Stress: Not on file  Relationships  . Social connections:    Talks on phone: Not on file    Gets together: Not on file    Attends religious service: Not on file    Active member of club or organization: Not on file    Attends meetings of clubs or organizations: Not on file    Relationship status: Not on file  . Intimate partner violence:    Fear of current or ex partner: Not on file    Emotionally abused: Not on file    Physically abused: Not on file    Forced sexual activity: Not on file  Other Topics Concern  . Not on file  Social History Narrative   Domestic partner, not sexually active [name] also a patient here). No kids. Shares home with a friend-bought together.       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     Past Surgical History:  Procedure  Laterality Date  . none      Family History  Problem Relation Age of Onset  . Heart disease Mother        stopped psychiatric meds and BP meds, later with MI  . Hypertension Mother   . Schizophrenia Mother   . Heart disease Father        sleep apnea not controlled. 74.     Allergies  Allergen Reactions  . Aspirin Other (See Comments)    Abdominal cramping, gi discomfort  . Milk-Related Compounds     GI Symptoms  . Penicillins Palpitations    Makes heart race    Current Medications:   Current Outpatient Medications:  .  ALPRAZolam (XANAX) 0.5 MG tablet, TAKE 1 TABLET BY MOUTH ONCE DAILY AS NEEDED FOR SLEEP, Disp: 30 tablet, Rfl: 5 .  BYSTOLIC 5 MG tablet, TAKE 1 TABLET BY MOUTH ONCE DAILY, Disp: 90 tablet, Rfl: 3 .  diclofenac (VOLTAREN) 75 MG EC tablet, Take 1 tablet (75 mg total) by mouth 2 (two) times daily., Disp: 14 tablet, Rfl: 0 .  omeprazole (PRILOSEC) 20  MG capsule, TAKE 1 CAPSULE BY MOUTH DAILY., Disp: 90 capsule, Rfl: 3 .  saw palmetto 500 MG capsule, Take 500 mg by mouth daily., Disp: , Rfl:  .  sulfacetamide (BLEPH-10) 10 % ophthalmic ointment, every 6 (six) hours., Disp: , Rfl:  .  tobramycin-dexamethasone (TOBRADEX) ophthalmic solution, , Disp: , Rfl:  .  doxycycline (VIBRA-TABS) 100 MG tablet, Take 1 tablet (100 mg total) by mouth 2 (two) times daily., Disp: 20 tablet, Rfl: 0 .  fluticasone (FLONASE) 50 MCG/ACT nasal spray, Place 2 sprays into both nostrils daily., Disp: 16 g, Rfl: 2   Review of Systems:   Review of Systems  HENT: Positive for congestion (Nasal, yellow drainage) and hoarse voice. Negative for ear discharge, ear pain and trouble swallowing.   Respiratory: Negative for shortness of breath.   Gastrointestinal: Negative for diarrhea.  Neurological: Negative for headaches.    Vitals:   Vitals:   05/26/18 1120  BP: 140/90  Pulse: 73  Temp: 98.6 F (37 C)  TempSrc: Oral  SpO2: 96%  Weight: 172 lb 8 oz (78.2 kg)  Height: 5\' 10"  (1.778 m)     Body mass index is 24.75 kg/m.  Physical Exam:   Physical Exam Vitals signs and nursing note reviewed.  Constitutional:      General: He is not in acute distress.    Appearance: He is well-developed. He is not ill-appearing or toxic-appearing.  HENT:     Head: Normocephalic and atraumatic.     Right Ear: Tympanic membrane, ear canal and external ear normal. Tympanic membrane is not erythematous, retracted or bulging.     Left Ear: Tympanic membrane, ear canal and external ear normal. Tympanic membrane is not erythematous, retracted or bulging.     Nose: Mucosal edema, congestion and rhinorrhea present.     Right Sinus: Maxillary sinus tenderness present. No frontal sinus tenderness.     Left Sinus: Maxillary sinus tenderness present. No frontal sinus tenderness.     Mouth/Throat:     Pharynx: Uvula midline. Posterior oropharyngeal erythema present.     Tonsils: No  tonsillar exudate. Swelling: 0 on the right. 0 on the left.  Eyes:     General: Lids are normal.     Conjunctiva/sclera: Conjunctivae normal.  Neck:     Trachea: Trachea normal.  Cardiovascular:     Rate and Rhythm: Normal rate and regular rhythm.  Heart sounds: Normal heart sounds, S1 normal and S2 normal.  Pulmonary:     Effort: Pulmonary effort is normal.     Breath sounds: Normal breath sounds. No decreased breath sounds, wheezing, rhonchi or rales.  Lymphadenopathy:     Cervical: No cervical adenopathy.  Skin:    General: Skin is warm and dry.  Neurological:     Mental Status: He is alert.  Psychiatric:        Speech: Speech normal.        Behavior: Behavior normal. Behavior is cooperative.      Assessment and Plan:   Bing was seen today for sore throat.  Diagnoses and all orders for this visit:  Acute non-recurrent maxillary sinusitis No red flags on exam.  Will initiate flonase per orders. I did give safety net prescription of doxycycline should he need it.  Discussed taking medications as prescribed. Reviewed return precautions including worsening fever, SOB, worsening cough or other concerns. Push fluids and rest. I recommend that patient follow-up if symptoms worsen or persist despite treatment x 7-10 days, sooner if needed.  Other orders -     fluticasone (FLONASE) 50 MCG/ACT nasal spray; Place 2 sprays into both nostrils daily. -     doxycycline (VIBRA-TABS) 100 MG tablet; Take 1 tablet (100 mg total) by mouth 2 (two) times daily.   . Reviewed expectations re: course of current medical issues. . Discussed self-management of symptoms. . Outlined signs and symptoms indicating need for more acute intervention. . Patient verbalized understanding and all questions were answered. . See orders for this visit as documented in the electronic medical record. . Patient received an After-Visit Summary.  CMA or LPN served as scribe during this visit. History,  Physical, and Plan performed by medical provider. The above documentation has been reviewed and is accurate and complete.  Inda Coke, PA-C

## 2018-06-08 MED FILL — ALPRAZolam 0.5 MG TABS: 0.5 | 30 days supply | Qty: 30 | Fill #1

## 2018-06-19 MED FILL — OMEPRAZOLE 20 MG CPDR: 20 | 90 days supply | Qty: 90 | Fill #2

## 2018-06-26 ENCOUNTER — Ambulatory Visit: Payer: 59 | Admitting: Family Medicine

## 2018-06-26 ENCOUNTER — Encounter: Payer: Self-pay | Admitting: Family Medicine

## 2018-06-26 VITALS — BP 158/92 | HR 93 | Temp 99.0°F | Ht 70.0 in | Wt 174.2 lb

## 2018-06-26 DIAGNOSIS — J0100 Acute maxillary sinusitis, unspecified: Secondary | ICD-10-CM | POA: Diagnosis not present

## 2018-06-26 MED ORDER — DOXYCYCLINE HYCLATE 100 MG PO TABS: 100.0000 mg | ORAL_TABLET | Freq: Two times a day (BID) | ORAL | 0 refills | Status: DC

## 2018-06-26 MED FILL — DOXYCYCLINE HYCLATE 100 MG: 100 | 10 days supply | Qty: 20 | Fill #0

## 2018-06-26 NOTE — Patient Instructions (Signed)

## 2018-06-26 NOTE — Progress Notes (Signed)
Patient: Angel Ellis MRN: [id number] DOB: Sep 27, 1955 PCP: Marin Olp, MD     Subjective:  Chief Complaint  Patient presents with  . Headache  . Cough    HPI: The patient is a 63 y.o. male who presents today for headache and cough since last Wednesday. Thursday he had a hacky cough, scratchy cough, subjective fever and congestion. He uses a nasal spray at night, not a steroid spray. He has not used any cough medication over the counter. He has used a benadryl. He took an Administrator cold and flu. He is not achy and doesn't hurt. He is still coughing, quite congested and feels tight in his chest. He has has no asthma or COPD. Past hx of smoking, but not current. 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   Review of Systems  Constitutional: Positive for fever. Negative for chills and fatigue.  HENT: Positive for congestion, postnasal drip, rhinorrhea and voice change. Negative for ear pain, sinus pressure and sinus pain.   Respiratory: Positive for cough. Negative for shortness of breath.   Cardiovascular: Negative for chest pain.  Gastrointestinal: Negative for abdominal pain and nausea.  Neurological: Positive for headaches. Negative for dizziness.  Psychiatric/Behavioral: Negative for sleep disturbance.    Allergies Patient is allergic to aspirin; milk-related compounds; and penicillins.  Past Medical History Patient  has a past medical history of Diverticulitis of colon (10/29/2009), Diverticulosis, GERD (gastroesophageal reflux disease), Hypertension, Lipoma of neck, Nasal congestion, and Sebaceous cyst (01/07/2009).  Surgical History Patient  has a past surgical history that includes none.  Family History Pateint's family history includes Heart disease in his father and mother; Hypertension in his mother; Schizophrenia in his mother.  Social History Patient  reports that he quit smoking about 8 years ago. His smoking use included cigarettes. He  has a 37.00 pack-year smoking history. He has never used smokeless tobacco. He reports current alcohol use of about 14.0 standard drinks of alcohol per week. He reports that he does not use drugs.    Objective: Vitals:   06/26/18 0845  BP: (!) 158/92  Pulse: 93  Temp: 99 F (37.2 C)  TempSrc: Oral  SpO2: 98%  Weight: 174 lb 3.2 oz (79 kg)  Height: 5\' 10"  (1.778 m)    Body mass index is 25 kg/m.  Physical Exam Vitals signs reviewed.  Constitutional:      Appearance: He is well-developed.  HENT:     Head: Normocephalic and atraumatic.     Comments: No TTP over sinuses.  Nasal turbinates with moderately severe edema bilaterally     Mouth/Throat:     Comments: +cobblestoning on posterior pharynx  Eyes:     Extraocular Movements: Extraocular movements intact.     Pupils: Pupils are equal, round, and reactive to light.  Neck:     Musculoskeletal: Normal range of motion and neck supple.  Cardiovascular:     Rate and Rhythm: Normal rate and regular rhythm.     Heart sounds: Normal heart sounds.  Pulmonary:     Effort: Pulmonary effort is normal. No respiratory distress.     Breath sounds: Normal breath sounds. No wheezing or rales.  Abdominal:     General: Bowel sounds are normal.     Palpations: Abdomen is soft.  Lymphadenopathy:     Cervical: No cervical adenopathy.  Skin:    General: Skin is warm and dry.     Capillary Refill: Capillary refill takes less than  2 seconds.  Neurological:     Mental Status: He is alert.  Psychiatric:        Mood and Affect: Mood normal.        Behavior: Behavior normal.        Assessment/plan: 1. Acute non-recurrent maxillary sinusitis Course of doxycycline and want him to start flonase back up. Also suggested cool mist humidifier. Declines low dose steroid shot. BP is a little above goal. Let us know if not getting better.      Return if symptoms worsen or fail to improve.   Orma Flaming, MD [place]    06/26/2018

## 2018-07-26 ENCOUNTER — Ambulatory Visit (HOSPITAL_COMMUNITY)
Admission: EM | Admit: 2018-07-26 | Discharge: 2018-07-26 | Disposition: A | Discharge status: Home / Self Care | Payer: 59 | Attending: Family Medicine | Admitting: Family Medicine

## 2018-07-26 ENCOUNTER — Encounter (HOSPITAL_COMMUNITY): Payer: Self-pay

## 2018-07-26 ENCOUNTER — Other Ambulatory Visit: Payer: Self-pay

## 2018-07-26 DIAGNOSIS — H1032 Unspecified acute conjunctivitis, left eye: Secondary | ICD-10-CM | POA: Diagnosis not present

## 2018-07-26 MED ORDER — TOBRAMYCIN-DEXAMETHASONE 0.3-0.1 % OP SUSP: 1.0000 [drp] | Freq: Four times a day (QID) | OPHTHALMIC | 0 refills | Status: AC

## 2018-07-26 MED FILL — TOBRAMYCIN-DEXAMETH OPTH SU: 0.3-0.1 | 25 days supply | Qty: 5 | Fill #0

## 2018-07-26 NOTE — ED Provider Notes (Signed)
[facility]    CSN: [id number] Arrival date & time: 07/26/18  0865     History   Chief Complaint Chief Complaint  Patient presents with  . Eye Pain    HPI CASHTYN POULIOT is a 63 y.o. male.   63 year old male comes in for 3-4 day history of left eye redness, pain, irritation. States it feels dry. Has noticed some crusting in the morning. Patient with history of blepharoconjunctivitis requiring tobradex for relief. States this feels similar. He denies any vision changes, photophobia. He has a floater in the left eye that has been there for many years. Denies new floaters, flashes of light. He has been using clear eyes without relief.      Past Medical History:  Diagnosis Date  . Diverticulitis of colon 10/29/2009   After august 1995    . Diverticulosis   . GERD (gastroesophageal reflux disease)   . Hypertension   . Lipoma of neck    L neck-remains in place  . Nasal congestion   . Sebaceous cyst 01/07/2009   R axilla      Patient Active Problem List   Diagnosis Date Noted  . Insomnia 08/23/2017  . Gout 07/23/2014  . Allergic rhinitis 07/23/2014  . Essential hypertension 07/23/2014  . Rosacea 07/23/2014  . 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 Basal cell carcinoma of skin 01/07/2009  . Former smoker 06/29/2007  . Hyperlipidemia 02/15/2007  . GERD 02/15/2007    Past Surgical History:  Procedure Laterality Date  . none         Home Medications    Prior to Admission medications   Medication Sig Start Date End Date Taking? Authorizing Provider  ALPRAZolam Duanne Moron) 0.5 MG tablet TAKE 1 TABLET BY MOUTH ONCE DAILY AS NEEDED FOR SLEEP 03/20/18   Marin Olp, MD  BYSTOLIC 5 MG tablet TAKE 1 TABLET BY MOUTH ONCE DAILY 11/15/17   Marin Olp, MD  doxycycline (VIBRA-TABS) 100 MG tablet Take 1 tablet (100 mg total) by mouth 2 (two) times daily. 06/26/18   Orma Flaming, MD  fluticasone (FLONASE) 50 MCG/ACT nasal spray Place 2 sprays into both nostrils daily.  05/26/18   Inda Coke, PA  omeprazole (PRILOSEC) 20 MG capsule TAKE 1 CAPSULE BY MOUTH DAILY. 12/19/17   Marin Olp, MD  saw palmetto 500 MG capsule Take 500 mg by mouth daily.    [provider]  tobramycin-dexamethasone [facility]) ophthalmic solution Place 1 drop into the left eye 4 (four) times daily for 7 days. 07/26/18 08/02/18  Ok Edwards, PA-C    Family History Family History  Problem Relation Age of Onset  . Heart disease Mother        stopped psychiatric meds and BP meds, later with MI  . Hypertension Mother   . Schizophrenia Mother   . Heart disease Father        sleep apnea not controlled. 45.     Social History Social History   Tobacco Use  . Smoking status: Former Smoker    Packs/day: 1.00    Years: 37.00    Pack years: 37.00    Types: Cigarettes    Last attempt to quit: 12/08/2009    Years since quitting: 8.6  . Smokeless tobacco: Never Used  Substance Use Topics  . Alcohol use: Yes    Alcohol/week: 14.0 standard drinks    Types: 14 Standard drinks or equivalent per week    Comment: one per day  . Drug  use: No     Allergies   Aspirin; Milk-related compounds; and Penicillins   Review of Systems Review of Systems  Reason unable to perform ROS: See HPI as above.     Physical Exam Triage Vital Signs ED Triage Vitals  Enc Vitals Group     BP 07/26/18 0903 128/90     Pulse Rate 07/26/18 0903 71     Resp 07/26/18 0903 18     Temp 07/26/18 0903 97.6 F (36.4 C)     Temp Source 07/26/18 0903 Oral     SpO2 07/26/18 0903 100 %     Weight 07/26/18 0901 170 lb (77.1 kg)     Height --      Head Circumference --      Peak Flow --      Pain Score 07/26/18 0901 1     Pain Loc --      Pain Edu? --      Excl. in [place]? --    No data found.  Updated Vital Signs BP 128/90 (BP Location: Right Arm)   Pulse 71   Temp 97.6 F (36.4 C) (Oral)   Resp 18   Wt 170 lb (77.1 kg)   SpO2 100%   BMI 24.39 kg/m   Visual Acuity Right Eye  Distance: 20/30 Left Eye Distance: 20/40(patient wearing glasses/wears glasses all the time) Bilateral Distance: 20/30  Right Eye Near:   Left Eye Near:    Bilateral Near:     Physical Exam Constitutional:      General: He is not in acute distress.    Appearance: He is well-developed. He is not diaphoretic.  HENT:     Head: Normocephalic and atraumatic.  Eyes:     General: Lids are normal.     Extraocular Movements: Extraocular movements intact.     Conjunctiva/sclera:     Right eye: Right conjunctiva is not injected.     Pupils: Pupils are equal, round, and reactive to light.     Comments: Conjunctival/scleral injection to the inferior and lateral left eye. No ciliary injection noted. No photophobia on exam.  Neurological:     Mental Status: He is alert and oriented to person, place, and time.     UC Treatments / Results  Labs (all labs ordered are listed, but only abnormal results are displayed) Labs Reviewed - No data to display  EKG None  Radiology No results found.  Procedures Procedures (including critical care time)  Medications Ordered in UC Medications - No data to display  Initial Impression / Assessment and Plan / UC Course  I have reviewed the triage vital signs and the nursing notes.  Pertinent labs & imaging results that were available during my care of the patient were reviewed by me and considered in my medical decision making (see chart for details).    Will start tobradex as directed. Other symptomatic treatment discussed. Patient to follow up with ophthalmology for recheck. Return precautions given. Patient expresses understanding and agrees to plan.  Final Clinical Impressions(s) / UC Diagnoses   Final diagnoses:  Acute conjunctivitis of left eye, unspecified acute conjunctivitis type    ED Prescriptions    Medication Sig Dispense Auth. Provider   tobramycin-dexamethasone [facility]) ophthalmic solution Place 1 drop into the left eye 4  (four) times daily for 7 days. 1.4 mL Tobin Chad, [place] 07/26/18 [phone]

## 2018-07-26 NOTE — Discharge Instructions (Signed)
Use tobradex eyedrops as directed on left eye as directed. Artificial tear gel (systane, genteal) at night. Wait 10-15 minutes between drops, always use artificial tear gel last, as it prevents drops from penetrating through. Lid scrubs and warm compresses as directed. Monitor for any worsening of symptoms, changes in vision, sensitivity to light, eye swelling, painful eye movement, follow up with ophthalmology for further evaluation.

## 2018-07-26 NOTE — ED Triage Notes (Signed)
Pt cc he states he has dry eye in his left eye. Pt states he has been using clear eye . Today has been the worst of 4 days.

## 2018-08-07 MED FILL — ALPRAZolam 0.5 MG TABS: 0.5 | 30 days supply | Qty: 30 | Fill #0

## 2018-08-14 MED FILL — BYSTOLIC 5 MG TABLET: 5 | 90 days supply | Qty: 90 | Fill #0

## 2018-09-22 ENCOUNTER — Other Ambulatory Visit: Payer: Self-pay | Admitting: Family Medicine

## 2018-09-22 MED FILL — OMEPRAZOLE 20 MG CPDR: 20 | 90 days supply | Qty: 90 | Fill #0

## 2018-09-22 NOTE — Telephone Encounter (Signed)
Rx refill Last fill 03/20/18  #30/5

## 2018-09-23 ENCOUNTER — Other Ambulatory Visit: Payer: Self-pay | Admitting: Family Medicine

## 2018-09-23 MED FILL — ALPRAZolam 0.5 MG TABS: 0.5 | 30 days supply | Qty: 30 | Fill #0

## 2018-11-07 ENCOUNTER — Ambulatory Visit (INDEPENDENT_AMBULATORY_CARE_PROVIDER_SITE_OTHER): Payer: 59 | Admitting: Family Medicine

## 2018-11-07 ENCOUNTER — Other Ambulatory Visit: Payer: Self-pay

## 2018-11-07 ENCOUNTER — Encounter: Payer: Self-pay | Admitting: Family Medicine

## 2018-11-07 ENCOUNTER — Encounter: Payer: 59 | Admitting: Family Medicine

## 2018-11-07 VITALS — BP 138/84 | HR 64 | Temp 98.6°F | Ht 70.0 in | Wt 173.6 lb

## 2018-11-07 DIAGNOSIS — Z87891 Personal history of nicotine dependence: Secondary | ICD-10-CM

## 2018-11-07 DIAGNOSIS — E785 Hyperlipidemia, unspecified: Secondary | ICD-10-CM

## 2018-11-07 DIAGNOSIS — I700 Atherosclerosis of aorta: Secondary | ICD-10-CM | POA: Diagnosis not present

## 2018-11-07 DIAGNOSIS — Z0000 Encounter for general adult medical examination without abnormal findings: Secondary | ICD-10-CM | POA: Diagnosis not present

## 2018-11-07 DIAGNOSIS — M1009 Idiopathic gout, multiple sites: Secondary | ICD-10-CM

## 2018-11-07 DIAGNOSIS — Z125 Encounter for screening for malignant neoplasm of prostate: Secondary | ICD-10-CM

## 2018-11-07 DIAGNOSIS — I10 Essential (primary) hypertension: Secondary | ICD-10-CM | POA: Diagnosis not present

## 2018-11-07 LAB — URINALYSIS
Bilirubin Urine: NEGATIVE
Hgb urine dipstick: NEGATIVE
Ketones, ur: NEGATIVE
Leukocytes,Ua: NEGATIVE
Nitrite: NEGATIVE
Specific Gravity, Urine: 1.02 (ref 1.000–1.030)
Total Protein, Urine: NEGATIVE
Urine Glucose: NEGATIVE
Urobilinogen, UA: 0.2 (ref 0.0–1.0)
pH: 5.5 (ref 5.0–8.0)

## 2018-11-07 LAB — COMPREHENSIVE METABOLIC PANEL
ALT: 30 U/L (ref 0–53)
AST: 37 U/L (ref 0–37)
Albumin: 4.1 g/dL (ref 3.5–5.2)
Alkaline Phosphatase: 56 U/L (ref 39–117)
BUN: 8 mg/dL (ref 6–23)
CO2: 28 mEq/L (ref 19–32)
Calcium: 8.6 mg/dL (ref 8.4–10.5)
Chloride: 100 mEq/L (ref 96–112)
Creatinine, Ser: 0.74 mg/dL (ref 0.40–1.50)
GFR: 106.72 mL/min (ref >60.00)
Glucose, Bld: 93 mg/dL (ref 70–99)
Potassium: 3.9 mEq/L (ref 3.5–5.1)
Sodium: 137 mEq/L (ref 135–145)
Total Bilirubin: 0.6 mg/dL (ref 0.2–1.2)
Total Protein: 7.1 g/dL (ref 6.0–8.3)

## 2018-11-07 LAB — LIPID PANEL
Cholesterol: 212 mg/dL — ABNORMAL HIGH (ref 0–200)
HDL: 61.8 mg/dL (ref >39.00)
Total CHOL/HDL Ratio: 3
Triglycerides: 513 mg/dL — ABNORMAL HIGH (ref 0.0–149.0)

## 2018-11-07 LAB — CBC
HCT: 42.1 % (ref 39.0–52.0)
Hemoglobin: 14 g/dL (ref 13.0–17.0)
MCHC: 33.2 g/dL (ref 30.0–36.0)
MCV: 94 fl (ref 78.0–100.0)
Platelets: 208 10*3/uL (ref 150.0–400.0)
RBC: 4.48 Mil/uL (ref 4.22–5.81)
RDW: 13.5 % (ref 11.5–15.5)
WBC: 5 10*3/uL (ref 4.0–10.5)

## 2018-11-07 LAB — LDL CHOLESTEROL, DIRECT: Direct LDL: 71 mg/dL

## 2018-11-07 LAB — URIC ACID: Uric Acid, Serum: 7.5 mg/dL (ref 4.0–7.8)

## 2018-11-07 LAB — PSA: PSA: 1.13 ng/mL (ref 0.10–4.00)

## 2018-11-07 MED ORDER — FLUTICASONE PROPIONATE 50 MCG/ACT NA SUSP: 2.0000 | Freq: Every day | NASAL | 5 refills | Status: DC

## 2018-11-07 MED ORDER — COLCHICINE 0.6 MG PO TABS: ORAL_TABLET | ORAL | 5 refills | Status: DC

## 2018-11-07 MED FILL — FLUTICASONE PROP 50 MCG SPR: 50 | 30 days supply | Qty: 16 | Fill #0

## 2018-11-07 MED FILL — COLCHICINE 0.6 MG TABS: 0.6 | 10 days supply | Qty: 10 | Fill #0

## 2018-11-07 NOTE — Patient Instructions (Addendum)
Please stop by lab before you go If you do not have mychart- we will call you about results within 5 business days of [place] receiving them.  If you have mychart- we will send your results within 3 business days of [place] receiving them.  If abnormal or we want to clarify a result, we will call or mychart you to make sure you receive the message.  If you have questions or concerns or don't hear within 5-7 days, please send [place] a message or call us.    No changes today other than trying perhaps half a claritin before bed and doing flonase consistently to calm allergies down

## 2018-11-07 NOTE — Progress Notes (Signed)
Phone: [phone]   Subjective:  Patient presents today for their annual physical. Chief complaint-noted.   See problem oriented charting- ROS- full  review of systems was completed and negative except for: runny nose, sneezing, chronic morning cough with allergies, seasonal allergies, food allergies  The following were reviewed and entered/updated in epic: Past Medical History:  Diagnosis Date  . Diverticulitis of colon 10/29/2009   After august 1995    . Diverticulosis   . GERD (gastroesophageal reflux disease)   . Hypertension   . Lipoma of neck    L neck-remains in place  . Nasal congestion   . Sebaceous cyst 01/07/2009   R axilla     Patient Active Problem List   Diagnosis Date Noted  . Gout 07/23/2014    Priority: Medium  . Essential hypertension 07/23/2014    Priority: Medium  . 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 Former smoker 06/29/2007    Priority: Medium  . Hyperlipidemia 02/15/2007    Priority: Medium  . Allergic rhinitis 07/23/2014    Priority: Low  . Rosacea 07/23/2014    Priority: Low  . Basal cell carcinoma of skin 01/07/2009    Priority: Low  . GERD 02/15/2007    Priority: Low  . Aortic atherosclerosis ([place]) 11/07/2018  . Insomnia 08/23/2017   Past Surgical History:  Procedure Laterality Date  . none      Family History  Problem Relation Age of Onset  . Heart disease Mother        stopped psychiatric meds and BP meds, later with MI  . Hypertension Mother   . Schizophrenia Mother   . Heart disease Father        sleep apnea not controlled. 74.     Medications- reviewed and updated Current Outpatient Medications  Medication Sig Dispense Refill  . ALPRAZolam (XANAX) 0.5 MG tablet TAKE 1 TABLET BY MOUTH DAILY AS NEEDED FOR SLEEP 30 tablet 3  . BYSTOLIC 5 MG tablet TAKE 1 TABLET BY MOUTH ONCE DAILY 90 tablet 3  . fluticasone (FLONASE) 50 MCG/ACT nasal spray Place 2 sprays into both nostrils daily. 16 g 5  . omeprazole  (PRILOSEC) 20 MG capsule TAKE 1 CAPSULE BY MOUTH DAILY. 90 capsule 3  . saw palmetto 500 MG capsule Take 500 mg by mouth daily.    . colchicine (COLCRYS) 0.6 MG tablet take 1 tablet by mouth every 1 to 2 hours UNTIL ONE OF THE FOLLOWING OCCURS.1.THE PAIN IS GONE.OR 2.THE MAXIMUM DOSE GIVEN IS NO MORE THAN 3 10 tablet 5   No current facility-administered medications for this visit.     Allergies-reviewed and updated Allergies  Allergen Reactions  . Aspirin Other (See Comments)    Abdominal cramping, gi discomfort  . Milk-Related Compounds     GI Symptoms  . Penicillins Palpitations    Makes heart race    Social History   Social History Narrative   Domestic partner, not sexually active [name] also a patient here). No kids. Shares home with a friend-bought together.       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    Objective  Objective:  BP 138/84 (BP Location: Left Arm, Patient Position: Sitting, Cuff Size: Normal)   Pulse 64   Temp 98.6 F (37 C) (Oral)   Ht 5\' 10"  (1.778 m)   Wt 173 lb 9.6 oz (78.7 kg)   SpO2 98%   BMI 24.91 kg/m  Gen: NAD, resting comfortably HEENT: Mucous membranes are moist. Oropharynx normal Neck: no thyromegaly or cervical lymphadneopathy CV: RRR no murmurs rubs or gallops Lungs: CTAB no crackles, wheeze, rhonchi Abdomen: soft/nontender/nondistended/normal bowel sounds. No rebound or guarding.  Ext: no edema Skin: warm, dry Neuro: grossly normal, moves all extremities, PERRLA   Assessment and Plan  63 y.o. male presenting for annual physical.  Health Maintenance counseling: 1. Anticipatory guidance: Patient counseled regarding regular dental exams -q6 months, eye exams -yearly,  avoiding smoking and second hand smoke , limiting alcohol to 2 beverages per day .   2. Risk factor reduction:  Advised patient of need for regular exercise and diet rich and fruits and vegetables to reduce risk of  heart attack and stroke. Exercise- 10-11 miles a day in his job. 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 Discussed improving diet- fixing food at home Wt Readings from Last 3 Encounters:  11/07/18 173 lb 9.6 oz (78.7 kg)  07/26/18 170 lb (77.1 kg)  06/26/18 174 lb 3.2 oz (79 kg)  3. Immunizations/screenings/ancillary studies- defer shingrix until next year Immunization History  Administered Date(s) Administered  . Influenza,inj,Quad PF,6+ Mos 01/08/2013  . Influenza-Unspecified 02/23/2015  . Td 08/08/2005, 07/07/2016  . Tdap 10/03/2015  4. Prostate cancer screening- trend PSA today. Defer rectal as psa trend low risk.  Takes saw palmetto daily Lab Results  Component Value Date   PSA 0.94 11/04/2017   PSA 0.76 09/26/2015   PSA 0.66 03/14/2013   5. Colon cancer screening - 01/2009 and will be due later this year 6. Skin cancer screening- Dr. Ronnald Ramp of dermatology- also sees for rosacea. advised regular sunscreen use. Denies worrisome, changing, or new skin lesions.  7. former smoker- AAA screen at 20. Lung caner screening 11/2017  Status of chronic or acute concerns  GERD- Taking Prilosec 20mg . No breakthrough. Possible go down to pepcid next year if easier to find.   HTN- Well controlled on Bystolic 5 mg daily. Not checking BP at home. Denies HA, dizziness, visual changes.   Gout- no recent flare ups. Has Colcry's to take prn- has 6 pills left and expired- will send in refill. Avoids beer and ertain foods  HLD- has been on crestor in past- currently managed by diet and exercise. Update lipid panel today - he is taking flaxseed meal and metamucil heaping spoonful- he wants to see if that helps.   Insomnia- xanax prn. Sometimes can get away with half tablet.   Allergies- have been bad this year. Misplaced his flonase and things have been worse- refilled today. Feels tired on antihistamine- discussed possible half tablet  claritin before bed- he was taking them in the day and felt too sedated  Aortic atherosclerosis noted on last year CT lung cancer screening- this would increase our probability of suggesting statin  Also had fatty liver- so has to continue to exercise and try to eat healty  Sebaceous cyst - right axilla- has been removed by Dr. Arnoldo Morale in past but recurred- asked him to discuss with Dr. Ronnald Ramp as may need wider excision- even wonder about general surgery consult  1 year physical Lab/Order associations: fasting    ICD-10-CM   1. Preventative health care  Z00.00 CBC    Comprehensive metabolic panel    Lipid panel    PSA    Uric acid    Urinalysis  2. Essential hypertension  I10 CBC    Comprehensive metabolic  panel    Lipid panel    Urinalysis  3. Hyperlipidemia, unspecified hyperlipidemia type  E78.5 CBC    Comprehensive metabolic panel    Lipid panel    Urinalysis  4. Acute idiopathic gout of multiple sites  M10.09 Uric acid  5. Former smoker  Z87.891 Urinalysis  6. Screening for prostate cancer  Z12.5 PSA  7. Aortic atherosclerosis (HCC)  I70.0     Meds ordered this encounter  Medications  . fluticasone (FLONASE) 50 MCG/ACT nasal spray    Sig: Place 2 sprays into both nostrils daily.    Dispense:  16 g    Refill:  5  . colchicine (COLCRYS) 0.6 MG tablet    Sig: take 1 tablet by mouth every 1 to 2 hours UNTIL ONE OF THE FOLLOWING OCCURS.1.THE PAIN IS GONE.OR 2.THE MAXIMUM DOSE GIVEN IS NO MORE THAN 3    Dispense:  10 tablet    Refill:  5    Return precautions advised.  Garret Reddish, MD

## 2018-11-07 NOTE — Addendum Note (Signed)
Addended by: Francis Dowse T on: 11/07/2018 08:32 AM   Modules accepted: Orders

## 2018-11-08 ENCOUNTER — Other Ambulatory Visit: Payer: Self-pay | Admitting: Physical Therapy

## 2018-11-08 MED ORDER — ROSUVASTATIN CALCIUM 20 MG PO TABS: 20.0000 mg | ORAL_TABLET | ORAL | 3 refills | Status: DC

## 2018-11-08 MED FILL — ROSUVASTATIN CALCIUM 20 MG: 20 | 84 days supply | Qty: 24 | Fill #0

## 2018-11-09 ENCOUNTER — Other Ambulatory Visit: Payer: Self-pay | Admitting: Family Medicine

## 2018-11-09 MED FILL — BYSTOLIC 5 MG TABLET: 5 | 90 days supply | Qty: 90 | Fill #0

## 2018-11-17 ENCOUNTER — Telehealth: Payer: Self-pay

## 2018-11-17 NOTE — Telephone Encounter (Signed)
Called pt and left VM to call the office. Want to make sure insurance information has not changed.

## 2018-11-17 NOTE — Telephone Encounter (Signed)
Pt returned Brandy's vm. She was unavailable. Stated he would return call on Monday morning.

## 2018-11-17 NOTE — Telephone Encounter (Signed)
PA for Bystolic 5 mg  Unable to locate pt via [email or web address]

## 2018-11-20 NOTE — Telephone Encounter (Signed)
Called and spoke with patient, he confirmed that insurance is the same. Will need to contact the pharmacy to see if PA was sent in error.

## 2018-11-21 NOTE — Telephone Encounter (Signed)
Called and spoke with Butch Penny at [facility]. Confirmed that Bystolic does NOT require PA, request must have been sent in error.

## 2018-11-22 MED FILL — ALPRAZolam 0.5 MG TABS: 0.5 | 30 days supply | Qty: 30 | Fill #0

## 2018-12-28 ENCOUNTER — Other Ambulatory Visit: Payer: Self-pay | Admitting: Family Medicine

## 2018-12-28 MED FILL — OMEPRAZOLE 20 MG CAPSULE DR: 20 | 90 days supply | Qty: 90 | Fill #0

## 2019-01-16 ENCOUNTER — Telehealth: Payer: Self-pay | Admitting: Acute Care

## 2019-01-16 DIAGNOSIS — H5213 Myopia, bilateral: Secondary | ICD-10-CM | POA: Diagnosis not present

## 2019-01-16 DIAGNOSIS — H52223 Regular astigmatism, bilateral: Secondary | ICD-10-CM | POA: Diagnosis not present

## 2019-01-16 DIAGNOSIS — H524 Presbyopia: Secondary | ICD-10-CM | POA: Diagnosis not present

## 2019-01-16 NOTE — Telephone Encounter (Signed)
Script Screening patients for COVID-19 and reviewing new operational procedures  Greeting - The reason I am calling is to share with you some new changes to our processes that are designed to help [place] keep everyone safe. Is now a good time to speak with you? Patient says "no' - ask them when you can call back and let them know it's important to do this prior to their appointment.  Patient says "yes" - Doristine Devoid, Shanon Brow) the first thing I need to do is ask you some screening Questions.  1. To the best of your knowledge, have you been in close contact with any one with a confirmed diagnosis of COVID 19? o No - proceed to next question  2. Have you had any one or more of the following: fever, chills, cough, shortness of breath or any flu-like symptoms? o No - proceed to next question  3. Have you been diagnosed with or have a previous diagnosis of COVID 19? o No - proceed to next question  4. I am going to go over a few other symptoms with you. Please let me know if you are experiencing any of the following: . Ear, nose or throat discomfort . A sore throat . Headache . Muscle pain . Diarrhea . Loss of taste or smell o No - proceed to next question  Thank you for answering these questions. Please know we will ask you these questions or similar questions when you arrive for your appointment and again it's how we are keeping everyone safe. Also, to keep you safe, please use the provided hand sanitizer when you enter the building. (Insert pt name), we are asking everyone in the building to wear a mask because they help [place] prevent the spread of germs. Do you have a mask of your own, if not, we are happy to provide one for you. The last thing I want to go over with you is the no visitor guidelines. This means no one can attend the appointment with you unless you need physical assistance. I understand this may be different from your past appointments and I know this may be difficult but please  know if someone is driving you we are happy to call them for you once your appointment is over.  [INSERT [place]  (Insert pt name) I've given you a lot of information, what questions do you have about what I've talked about today or your appointment tomorrow? Benjie Karvonen, CMA

## 2019-01-17 ENCOUNTER — Inpatient Hospital Stay: Admission: RE | Admit: 2019-01-17 | Payer: 59 | Source: Ambulatory Visit

## 2019-01-17 ENCOUNTER — Other Ambulatory Visit: Payer: Self-pay

## 2019-01-17 ENCOUNTER — Ambulatory Visit (INDEPENDENT_AMBULATORY_CARE_PROVIDER_SITE_OTHER)
Admission: RE | Admit: 2019-01-17 | Discharge: 2019-01-17 | Disposition: A | Discharge status: Home / Self Care | Payer: 59 | Source: Ambulatory Visit | Attending: Acute Care | Admitting: Acute Care

## 2019-01-17 DIAGNOSIS — Z122 Encounter for screening for malignant neoplasm of respiratory organs: Secondary | ICD-10-CM

## 2019-01-17 DIAGNOSIS — Z87891 Personal history of nicotine dependence: Secondary | ICD-10-CM | POA: Diagnosis not present

## 2019-01-19 ENCOUNTER — Other Ambulatory Visit: Payer: Self-pay | Admitting: *Deleted

## 2019-01-19 DIAGNOSIS — Z87891 Personal history of nicotine dependence: Secondary | ICD-10-CM

## 2019-01-19 DIAGNOSIS — Z122 Encounter for screening for malignant neoplasm of respiratory organs: Secondary | ICD-10-CM

## 2019-01-19 MED FILL — ALPRAZolam 0.5 MG TABS: 0.5 | 30 days supply | Qty: 30 | Fill #1

## 2019-02-05 MED FILL — ROSUVASTATIN CALCIUM 20 MG: 20 | 84 days supply | Qty: 24 | Fill #1

## 2019-02-05 MED FILL — FLUTICASONE PROP 50 MCG SPR: 50 | 30 days supply | Qty: 16 | Fill #1

## 2019-02-07 MED FILL — COLCHICINE 0.6 MG TABS: 0.6 | 10 days supply | Qty: 10 | Fill #1

## 2019-02-12 ENCOUNTER — Other Ambulatory Visit: Payer: Self-pay | Admitting: Family Medicine

## 2019-02-12 MED FILL — BYSTOLIC 5 MG TABLET: 5 | 90 days supply | Qty: 90 | Fill #0

## 2019-02-27 MED FILL — TOBRAMYCIN-DEXAMETH OPTH SU: 0.3-0.1 | 25 days supply | Qty: 5 | Fill #0

## 2019-03-16 MED FILL — ALPRAZolam 0.5 MG TABS: 0.5 | 30 days supply | Qty: 30 | Fill #2

## 2019-03-27 ENCOUNTER — Other Ambulatory Visit: Payer: Self-pay | Admitting: Family Medicine

## 2019-03-27 MED FILL — OMEPRAZOLE 20 MG CAPSULE DR: 20 | 90 days supply | Qty: 90 | Fill #0

## 2019-05-10 ENCOUNTER — Other Ambulatory Visit: Payer: Self-pay | Admitting: Family Medicine

## 2019-05-14 MED FILL — ROSUVASTATIN CALCIUM 20 MG: 20 | 84 days supply | Qty: 24 | Fill #2

## 2019-05-14 NOTE — Telephone Encounter (Signed)
Pt requesting refill, appt scheduled in July.

## 2019-05-15 MED FILL — BYSTOLIC 5 MG TABLET: 5 | 90 days supply | Qty: 90 | Fill #0

## 2019-05-15 MED FILL — ALPRAZolam 0.5 MG TABS: 0.5 | 30 days supply | Qty: 30 | Fill #0

## 2019-06-28 ENCOUNTER — Other Ambulatory Visit: Payer: Self-pay | Admitting: Family Medicine

## 2019-06-28 MED FILL — OMEPRAZOLE DR 20 MG CAPSULE: 20 | 90 days supply | Qty: 90 | Fill #0

## 2019-06-29 MED FILL — ALPRAZolam 0.5 MG TABS: 0.5 | 30 days supply | Qty: 30 | Fill #1

## 2019-07-11 DIAGNOSIS — M9903 Segmental and somatic dysfunction of lumbar region: Secondary | ICD-10-CM | POA: Diagnosis not present

## 2019-07-11 DIAGNOSIS — M9901 Segmental and somatic dysfunction of cervical region: Secondary | ICD-10-CM | POA: Diagnosis not present

## 2019-07-11 DIAGNOSIS — M47816 Spondylosis without myelopathy or radiculopathy, lumbar region: Secondary | ICD-10-CM | POA: Diagnosis not present

## 2019-07-11 DIAGNOSIS — M4722 Other spondylosis with radiculopathy, cervical region: Secondary | ICD-10-CM | POA: Diagnosis not present

## 2019-07-11 DIAGNOSIS — M9902 Segmental and somatic dysfunction of thoracic region: Secondary | ICD-10-CM | POA: Diagnosis not present

## 2019-07-11 DIAGNOSIS — M5414 Radiculopathy, thoracic region: Secondary | ICD-10-CM | POA: Diagnosis not present

## 2019-07-18 DIAGNOSIS — M47816 Spondylosis without myelopathy or radiculopathy, lumbar region: Secondary | ICD-10-CM | POA: Diagnosis not present

## 2019-07-18 DIAGNOSIS — M9902 Segmental and somatic dysfunction of thoracic region: Secondary | ICD-10-CM | POA: Diagnosis not present

## 2019-07-18 DIAGNOSIS — M4722 Other spondylosis with radiculopathy, cervical region: Secondary | ICD-10-CM | POA: Diagnosis not present

## 2019-07-18 DIAGNOSIS — M5414 Radiculopathy, thoracic region: Secondary | ICD-10-CM | POA: Diagnosis not present

## 2019-07-18 DIAGNOSIS — M9903 Segmental and somatic dysfunction of lumbar region: Secondary | ICD-10-CM | POA: Diagnosis not present

## 2019-07-18 DIAGNOSIS — M9901 Segmental and somatic dysfunction of cervical region: Secondary | ICD-10-CM | POA: Diagnosis not present

## 2019-07-19 ENCOUNTER — Encounter (HOSPITAL_COMMUNITY): Payer: Self-pay

## 2019-07-19 ENCOUNTER — Other Ambulatory Visit: Payer: Self-pay

## 2019-07-19 ENCOUNTER — Ambulatory Visit (HOSPITAL_COMMUNITY)
Admission: EM | Admit: 2019-07-19 | Discharge: 2019-07-19 | Disposition: A | Discharge status: Home / Self Care | Payer: 59 | Attending: Family Medicine | Admitting: Family Medicine

## 2019-07-19 DIAGNOSIS — R1032 Left lower quadrant pain: Secondary | ICD-10-CM | POA: Diagnosis not present

## 2019-07-19 MED ORDER — AMOXICILLIN-POT CLAVULANATE 875-125 MG PO TABS: 1.0000 | ORAL_TABLET | Freq: Two times a day (BID) | ORAL | 0 refills | Status: DC

## 2019-07-19 MED FILL — AMOX-CLAV 875-125 MG TABLET: 875-125 | 7 days supply | Qty: 14 | Fill #0

## 2019-07-19 NOTE — ED Triage Notes (Signed)
Pt presents with chronic stress induced diverticulitis flare up since yesterday.

## 2019-07-19 NOTE — ED Provider Notes (Signed)
[place]    CSN: [id number] Arrival date & time: 07/19/19  [id number]      History   Chief Complaint Chief Complaint  Patient presents with  . Abdominal Pain    HPI Angel Ellis is a 64 y.o. male.   Patient is a 64 year old male with past medical history of diverticulitis, reticulosis, GERD, hypertension.  He presents today for left lower quadrant pain.  This started yesterday.  Believing it may be stress-induced.  The pain has been constant, waxing waning.  Reporting he did enema and had a bowel movement and still having discomfort.  Denies any associated fever, nausea, vomiting, diarrhea or constipation.  No blood in stool.  Reporting symptoms consistent with previous diverticulitis flare.  He is due for colonoscopy this year.  Denies any dysuria, hematuria or urinary frequency.  ROS per HPI      Past Medical History:  Diagnosis Date  . Diverticulitis of colon 10/29/2009   After august 1995    . Diverticulosis   . GERD (gastroesophageal reflux disease)   . Hypertension   . Lipoma of neck    L neck-remains in place  . Nasal congestion   . Sebaceous cyst 01/07/2009   R axilla      Patient Active Problem List   Diagnosis Date Noted  . Aortic atherosclerosis ([place]) 11/07/2018  . Insomnia 08/23/2017  . Gout 07/23/2014  . Allergic rhinitis 07/23/2014  . Essential hypertension 07/23/2014  . Rosacea 07/23/2014  . 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 Basal cell carcinoma of skin 01/07/2009  . Former smoker 06/29/2007  . Hyperlipidemia 02/15/2007  . GERD 02/15/2007    Past Surgical History:  Procedure Laterality Date  . none         Home Medications    Prior to Admission medications   Medication Sig Start Date End Date Taking? Authorizing Provider  ALPRAZolam Duanne Moron) 0.5 MG tablet TAKE 1 TABLET BY MOUTH DAILY AS NEEDED FOR SLEEP 05/14/19   Marin Olp, MD  BYSTOLIC 5 MG tablet TAKE 1 TABLET BY MOUTH DAILY 05/14/19   Marin Olp, MD    amoxicillin-clavulanate (AUGMENTIN) 875-125 MG tablet Take 1 tablet by mouth every 12 (twelve) hours. 07/19/19   Shafer Swamy, Tressia Miners A, NP  colchicine (COLCRYS) 0.6 MG tablet take 1 tablet by mouth every 1 to 2 hours UNTIL ONE OF THE FOLLOWING OCCURS.1.THE PAIN IS GONE.OR 2.THE MAXIMUM DOSE GIVEN IS NO MORE THAN 3 11/07/18   Marin Olp, MD  fluticasone [facility]) 50 MCG/ACT nasal spray Place 2 sprays into both nostrils daily. 11/07/18   Marin Olp, MD  omeprazole (PRILOSEC) 20 MG capsule TAKE 1 CAPSULE BY MOUTH DAILY. 06/28/19   Marin Olp, MD  rosuvastatin (CRESTOR) 20 MG tablet Take 1 tablet (20 mg total) by mouth 2 (two) times a week. 11/09/18   Marin Olp, MD  saw palmetto 500 MG capsule Take 500 mg by mouth daily.    [provider]    Family History Family History  Problem Relation Age of Onset  . Heart disease Mother        stopped psychiatric meds and BP meds, later with MI  . Hypertension Mother   . Schizophrenia Mother   . Heart disease Father        sleep apnea not controlled. 89.     Social History Social History   Tobacco Use  . Smoking status: Former Smoker    Packs/day: 1.00    Years: 37.00  Pack years: 37.00    Types: Cigarettes    Quit date: 12/08/2009    Years since quitting: 9.6  . Smokeless tobacco: Never Used  Substance Use Topics  . Alcohol use: Yes    Alcohol/week: 14.0 standard drinks    Types: 14 Standard drinks or equivalent per week    Comment: one per day  . Drug use: No     Allergies   Aspirin, Milk-related compounds, and Penicillins   Review of Systems Review of Systems   Physical Exam Triage Vital Signs ED Triage Vitals [07/19/19 0847]  Enc Vitals Group     BP (!) 153/38     Pulse Rate 78     Resp 16     Temp 98 F (36.7 C)     Temp Source Oral     SpO2 100 %     Weight      Height      Head Circumference      Peak Flow      Pain Score 5     Pain Loc      Pain Edu?      Excl. in [place]?    No data  found.  Updated Vital Signs BP (!) 153/38 (BP Location: Left Arm)   Pulse 78   Temp 98 F (36.7 C) (Oral)   Resp 16   SpO2 100%   Visual Acuity Right Eye Distance:   Left Eye Distance:   Bilateral Distance:    Right Eye Near:   Left Eye Near:    Bilateral Near:     Physical Exam Vitals and nursing note reviewed.  Constitutional:      Appearance: Normal appearance.  HENT:     Head: Normocephalic and atraumatic.     Nose: Nose normal.  Eyes:     Conjunctiva/sclera: Conjunctivae normal.  Pulmonary:     Effort: Pulmonary effort is normal.  Abdominal:     Palpations: Abdomen is soft.     Tenderness: There is abdominal tenderness in the left lower quadrant. There is no guarding or rebound.     Hernia: No hernia is present.  Musculoskeletal:        General: Normal range of motion.     Cervical back: Normal range of motion.  Skin:    General: Skin is warm and dry.  Neurological:     Mental Status: He is alert.  Psychiatric:        Mood and Affect: Mood normal.      UC Treatments / Results  Labs (all labs ordered are listed, but only abnormal results are displayed) Labs Reviewed - No data to display  EKG   Radiology No results found.  Procedures Procedures (including critical care time)  Medications Ordered in UC Medications - No data to display  Initial Impression / Assessment and Plan / UC Course  I have reviewed the triage vital signs and the nursing notes.  Pertinent labs & imaging results that were available during my care of the patient were reviewed by me and considered in my medical decision making (see chart for details).     Left lower quadrant pain-treating for diverticulitis flare based on symptoms and history. Treating with Augmentin. ER precautions given Follow up as needed for continued or worsening symptoms  Final Clinical Impressions(s) / UC Diagnoses   Final diagnoses:  LLQ pain     Discharge Instructions     Treating for  diverticulitis flare. Take medication as prescribed.  If your  symptoms worsen you will need to go to the ER. Follow up as needed for continued or worsening symptoms     ED Prescriptions    Medication Sig Dispense Auth. Provider   amoxicillin-clavulanate (AUGMENTIN) 875-125 MG tablet Take 1 tablet by mouth every 12 (twelve) hours. 14 tablet Miracle Mongillo A, NP     PDMP not reviewed this encounter.   Orvan July, NP 07/19/19 [phone]

## 2019-07-19 NOTE — Discharge Instructions (Signed)
Treating for diverticulitis flare. Take medication as prescribed.  If your symptoms worsen you will need to go to the ER. Follow up as needed for continued or worsening symptoms

## 2019-07-23 DIAGNOSIS — M9903 Segmental and somatic dysfunction of lumbar region: Secondary | ICD-10-CM | POA: Diagnosis not present

## 2019-07-23 DIAGNOSIS — M9901 Segmental and somatic dysfunction of cervical region: Secondary | ICD-10-CM | POA: Diagnosis not present

## 2019-07-23 DIAGNOSIS — M4722 Other spondylosis with radiculopathy, cervical region: Secondary | ICD-10-CM | POA: Diagnosis not present

## 2019-07-23 DIAGNOSIS — M47816 Spondylosis without myelopathy or radiculopathy, lumbar region: Secondary | ICD-10-CM | POA: Diagnosis not present

## 2019-07-23 DIAGNOSIS — M5414 Radiculopathy, thoracic region: Secondary | ICD-10-CM | POA: Diagnosis not present

## 2019-07-23 DIAGNOSIS — M9902 Segmental and somatic dysfunction of thoracic region: Secondary | ICD-10-CM | POA: Diagnosis not present

## 2019-07-25 DIAGNOSIS — M4722 Other spondylosis with radiculopathy, cervical region: Secondary | ICD-10-CM | POA: Diagnosis not present

## 2019-07-25 DIAGNOSIS — M9903 Segmental and somatic dysfunction of lumbar region: Secondary | ICD-10-CM | POA: Diagnosis not present

## 2019-07-25 DIAGNOSIS — M9901 Segmental and somatic dysfunction of cervical region: Secondary | ICD-10-CM | POA: Diagnosis not present

## 2019-07-25 DIAGNOSIS — M9902 Segmental and somatic dysfunction of thoracic region: Secondary | ICD-10-CM | POA: Diagnosis not present

## 2019-07-25 DIAGNOSIS — M47816 Spondylosis without myelopathy or radiculopathy, lumbar region: Secondary | ICD-10-CM | POA: Diagnosis not present

## 2019-07-25 DIAGNOSIS — M5414 Radiculopathy, thoracic region: Secondary | ICD-10-CM | POA: Diagnosis not present

## 2019-08-01 DIAGNOSIS — M47816 Spondylosis without myelopathy or radiculopathy, lumbar region: Secondary | ICD-10-CM | POA: Diagnosis not present

## 2019-08-01 DIAGNOSIS — M4722 Other spondylosis with radiculopathy, cervical region: Secondary | ICD-10-CM | POA: Diagnosis not present

## 2019-08-01 DIAGNOSIS — M5414 Radiculopathy, thoracic region: Secondary | ICD-10-CM | POA: Diagnosis not present

## 2019-08-01 DIAGNOSIS — M9902 Segmental and somatic dysfunction of thoracic region: Secondary | ICD-10-CM | POA: Diagnosis not present

## 2019-08-01 DIAGNOSIS — M9901 Segmental and somatic dysfunction of cervical region: Secondary | ICD-10-CM | POA: Diagnosis not present

## 2019-08-01 DIAGNOSIS — M9903 Segmental and somatic dysfunction of lumbar region: Secondary | ICD-10-CM | POA: Diagnosis not present

## 2019-08-06 DIAGNOSIS — M9901 Segmental and somatic dysfunction of cervical region: Secondary | ICD-10-CM | POA: Diagnosis not present

## 2019-08-06 DIAGNOSIS — M9902 Segmental and somatic dysfunction of thoracic region: Secondary | ICD-10-CM | POA: Diagnosis not present

## 2019-08-06 DIAGNOSIS — M47816 Spondylosis without myelopathy or radiculopathy, lumbar region: Secondary | ICD-10-CM | POA: Diagnosis not present

## 2019-08-06 DIAGNOSIS — M9903 Segmental and somatic dysfunction of lumbar region: Secondary | ICD-10-CM | POA: Diagnosis not present

## 2019-08-06 DIAGNOSIS — M4722 Other spondylosis with radiculopathy, cervical region: Secondary | ICD-10-CM | POA: Diagnosis not present

## 2019-08-06 DIAGNOSIS — M5414 Radiculopathy, thoracic region: Secondary | ICD-10-CM | POA: Diagnosis not present

## 2019-08-08 DIAGNOSIS — M9901 Segmental and somatic dysfunction of cervical region: Secondary | ICD-10-CM | POA: Diagnosis not present

## 2019-08-08 DIAGNOSIS — M4722 Other spondylosis with radiculopathy, cervical region: Secondary | ICD-10-CM | POA: Diagnosis not present

## 2019-08-08 DIAGNOSIS — M47816 Spondylosis without myelopathy or radiculopathy, lumbar region: Secondary | ICD-10-CM | POA: Diagnosis not present

## 2019-08-08 DIAGNOSIS — M9902 Segmental and somatic dysfunction of thoracic region: Secondary | ICD-10-CM | POA: Diagnosis not present

## 2019-08-08 DIAGNOSIS — M9903 Segmental and somatic dysfunction of lumbar region: Secondary | ICD-10-CM | POA: Diagnosis not present

## 2019-08-08 DIAGNOSIS — M5414 Radiculopathy, thoracic region: Secondary | ICD-10-CM | POA: Diagnosis not present

## 2019-08-13 ENCOUNTER — Other Ambulatory Visit: Payer: Self-pay | Admitting: Family Medicine

## 2019-08-13 DIAGNOSIS — M9903 Segmental and somatic dysfunction of lumbar region: Secondary | ICD-10-CM | POA: Diagnosis not present

## 2019-08-13 DIAGNOSIS — M9901 Segmental and somatic dysfunction of cervical region: Secondary | ICD-10-CM | POA: Diagnosis not present

## 2019-08-13 DIAGNOSIS — M47816 Spondylosis without myelopathy or radiculopathy, lumbar region: Secondary | ICD-10-CM | POA: Diagnosis not present

## 2019-08-13 DIAGNOSIS — M4722 Other spondylosis with radiculopathy, cervical region: Secondary | ICD-10-CM | POA: Diagnosis not present

## 2019-08-13 DIAGNOSIS — M9902 Segmental and somatic dysfunction of thoracic region: Secondary | ICD-10-CM | POA: Diagnosis not present

## 2019-08-13 DIAGNOSIS — M5414 Radiculopathy, thoracic region: Secondary | ICD-10-CM | POA: Diagnosis not present

## 2019-08-13 MED FILL — ROSUVASTATIN CALCIUM 20 MG: 20 | 84 days supply | Qty: 24 | Fill #3

## 2019-08-13 MED FILL — BYSTOLIC 5 MG TABLET: 5 | 90 days supply | Qty: 90 | Fill #0

## 2019-08-15 DIAGNOSIS — M47816 Spondylosis without myelopathy or radiculopathy, lumbar region: Secondary | ICD-10-CM | POA: Diagnosis not present

## 2019-08-15 DIAGNOSIS — M9901 Segmental and somatic dysfunction of cervical region: Secondary | ICD-10-CM | POA: Diagnosis not present

## 2019-08-15 DIAGNOSIS — M9902 Segmental and somatic dysfunction of thoracic region: Secondary | ICD-10-CM | POA: Diagnosis not present

## 2019-08-15 DIAGNOSIS — M5414 Radiculopathy, thoracic region: Secondary | ICD-10-CM | POA: Diagnosis not present

## 2019-08-15 DIAGNOSIS — M9903 Segmental and somatic dysfunction of lumbar region: Secondary | ICD-10-CM | POA: Diagnosis not present

## 2019-08-15 DIAGNOSIS — M4722 Other spondylosis with radiculopathy, cervical region: Secondary | ICD-10-CM | POA: Diagnosis not present

## 2019-08-20 DIAGNOSIS — M9901 Segmental and somatic dysfunction of cervical region: Secondary | ICD-10-CM | POA: Diagnosis not present

## 2019-08-20 DIAGNOSIS — M5414 Radiculopathy, thoracic region: Secondary | ICD-10-CM | POA: Diagnosis not present

## 2019-08-20 DIAGNOSIS — M4722 Other spondylosis with radiculopathy, cervical region: Secondary | ICD-10-CM | POA: Diagnosis not present

## 2019-08-20 DIAGNOSIS — M47816 Spondylosis without myelopathy or radiculopathy, lumbar region: Secondary | ICD-10-CM | POA: Diagnosis not present

## 2019-08-20 DIAGNOSIS — M9902 Segmental and somatic dysfunction of thoracic region: Secondary | ICD-10-CM | POA: Diagnosis not present

## 2019-08-20 DIAGNOSIS — M9903 Segmental and somatic dysfunction of lumbar region: Secondary | ICD-10-CM | POA: Diagnosis not present

## 2019-08-21 DIAGNOSIS — M47816 Spondylosis without myelopathy or radiculopathy, lumbar region: Secondary | ICD-10-CM | POA: Diagnosis not present

## 2019-08-21 DIAGNOSIS — M9901 Segmental and somatic dysfunction of cervical region: Secondary | ICD-10-CM | POA: Diagnosis not present

## 2019-08-21 DIAGNOSIS — M5414 Radiculopathy, thoracic region: Secondary | ICD-10-CM | POA: Diagnosis not present

## 2019-08-21 DIAGNOSIS — M9902 Segmental and somatic dysfunction of thoracic region: Secondary | ICD-10-CM | POA: Diagnosis not present

## 2019-08-21 DIAGNOSIS — M9903 Segmental and somatic dysfunction of lumbar region: Secondary | ICD-10-CM | POA: Diagnosis not present

## 2019-08-21 DIAGNOSIS — M4722 Other spondylosis with radiculopathy, cervical region: Secondary | ICD-10-CM | POA: Diagnosis not present

## 2019-08-27 DIAGNOSIS — M9902 Segmental and somatic dysfunction of thoracic region: Secondary | ICD-10-CM | POA: Diagnosis not present

## 2019-08-27 DIAGNOSIS — M47816 Spondylosis without myelopathy or radiculopathy, lumbar region: Secondary | ICD-10-CM | POA: Diagnosis not present

## 2019-08-27 DIAGNOSIS — M9903 Segmental and somatic dysfunction of lumbar region: Secondary | ICD-10-CM | POA: Diagnosis not present

## 2019-08-27 DIAGNOSIS — M4722 Other spondylosis with radiculopathy, cervical region: Secondary | ICD-10-CM | POA: Diagnosis not present

## 2019-08-27 DIAGNOSIS — M5414 Radiculopathy, thoracic region: Secondary | ICD-10-CM | POA: Diagnosis not present

## 2019-08-27 DIAGNOSIS — M9901 Segmental and somatic dysfunction of cervical region: Secondary | ICD-10-CM | POA: Diagnosis not present

## 2019-09-21 ENCOUNTER — Other Ambulatory Visit: Payer: Self-pay | Admitting: Family Medicine

## 2019-10-05 MED FILL — ALPRAZolam 0.5 MG TABS: 0.5 | 30 days supply | Qty: 30 | Fill #2

## 2019-11-06 NOTE — Progress Notes (Signed)
Phone: [phone]   Subjective:  Patient presents today for their annual physical. Chief complaint-noted.   See problem oriented charting- Review of Systems  Constitutional: Negative for chills and fever.  HENT: Negative for ear discharge and hearing loss.   Eyes: Negative for blurred vision and double vision.  Respiratory: Negative for cough (from allergies) and shortness of breath.   Cardiovascular: Positive for palpitations (rare pvc). Negative for chest pain.  Gastrointestinal: Negative for constipation, diarrhea, nausea and vomiting.  Genitourinary: Negative for dysuria and frequency.  Musculoskeletal: Negative for joint pain and myalgias.  Skin: Negative for itching and rash.  Neurological: Negative for dizziness and tremors.  Endo/Heme/Allergies: Negative for polydipsia. Bruises/bleeds easily (thin skin).  Psychiatric/Behavioral: Negative for depression, substance abuse and suicidal ideas.    The following were reviewed and entered/updated in epic: Past Medical History:  Diagnosis Date  . Diverticulitis of colon 10/29/2009   After august 1995    . Diverticulosis   . GERD (gastroesophageal reflux disease)   . Hypertension   . Lipoma of neck    L neck-remains in place  . Nasal congestion   . Sebaceous cyst 01/07/2009   R axilla     Patient Active Problem List   Diagnosis Date Noted  . Aortic atherosclerosis ([place]) 11/07/2018    Priority: Medium  . Insomnia 08/23/2017    Priority: Medium  . Gout 07/23/2014    Priority: Medium  . Essential hypertension 07/23/2014    Priority: Medium  . 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 Former smoker 06/29/2007    Priority: Medium  . Hyperlipidemia 02/15/2007    Priority: Medium  . Allergic rhinitis 07/23/2014    Priority: Low  . Rosacea 07/23/2014    Priority: Low  . Basal cell carcinoma of skin 01/07/2009    Priority: Low  . GERD 02/15/2007    Priority: Low  . Emphysema of lung ([place]) 11/08/2019  . Fatty  liver 11/08/2019   Past Surgical History:  Procedure Laterality Date  . none      Family History  Problem Relation Age of Onset  . Heart disease Mother        stopped psychiatric meds and BP meds, later with MI  . Hypertension Mother   . Schizophrenia Mother   . Heart disease Father        sleep apnea not controlled. 74.     Medications- reviewed and updated Current Outpatient Medications  Medication Sig Dispense Refill  . ALPRAZolam (XANAX) 0.5 MG tablet TAKE 1 TABLET BY MOUTH DAILY AS NEEDED FOR SLEEP 30 tablet 2  . BYSTOLIC 5 MG tablet TAKE 1 TABLET BY MOUTH DAILY 90 tablet 0  . Cholecalciferol (D3-1000) 25 MCG (1000 UT) tablet Take 1,000 Units by mouth daily.    . colchicine (COLCRYS) 0.6 MG tablet take 1 tablet by mouth every 1 to 2 hours UNTIL ONE OF THE FOLLOWING OCCURS.1.THE PAIN IS GONE.OR 2.THE MAXIMUM DOSE GIVEN IS NO MORE THAN 3 10 tablet 5  . fluticasone (FLONASE) 50 MCG/ACT nasal spray Place 2 sprays into both nostrils daily. 16 g 5  . loratadine (CLARITIN) 10 MG tablet Take 10 mg by mouth daily.    Marland Kitchen omeprazole (PRILOSEC) 20 MG capsule TAKE 1 CAPSULE BY MOUTH DAILY. 90 capsule 0  . rosuvastatin (CRESTOR) 20 MG tablet Take 1 tablet (20 mg total) by mouth 2 (two) times a week. 26 tablet 3  . saw palmetto 500 MG capsule Take 500 mg by mouth daily.  No current facility-administered medications for this visit.    Allergies-reviewed and updated Allergies  Allergen Reactions  . Aspirin Other (See Comments)    Abdominal cramping, gi discomfort  . Milk-Related Compounds     GI Symptoms  . Penicillins Palpitations    Makes heart race    Social History   Social History Narrative   Domestic partner, not sexually active [name] also a patient here). No kids.       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    Objective  Objective:  BP 140/90   Pulse 70   Temp 97.8 F (36.6 C)   Ht 5\' 10"  (1.778 m)   Wt  168 lb 12.8 oz (76.6 kg)   SpO2 98%   BMI 24.22 kg/m  Gen: NAD, resting comfortably HEENT: Mucous membranes are moist. Oropharynx normal Neck: no thyromegaly CV: RRR no murmurs rubs or gallops Lungs: CTAB no crackles, wheeze, rhonchi Abdomen: soft/nontender/nondistended/normal bowel sounds. No rebound or guarding.  Ext: no edema Skin: warm, dry Neuro: grossly normal, moves all extremities, PERRLA   Assessment and Plan  64 y.o. male presenting for annual physical.  Health Maintenance counseling: 1. Anticipatory guidance: Patient counseled regarding regular dental exams q6 months, eye exams yearly,  avoiding smoking and second hand smoke, limiting alcohol to 2 beverages per day.  Advised 7 or less with fatty liver 2. Risk factor reduction:  Advised patient of need for regular exercise and diet rich and fruits and vegetables to reduce risk of heart attack and stroke. 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 Diet-feels like he could transition to more fish and less red meat.  Wt Readings from Last 3 Encounters:  11/08/19 168 lb 12.8 oz (76.6 kg)  11/07/18 173 lb 9.6 oz (78.7 kg)  07/26/18 170 lb (77.1 kg)  3. Immunizations/screenings/ancillary studies-discussed Shingrix - opts out for now.  COVID-19 vaccination already done  Immunization History  Administered Date(s) Administered  . Influenza,inj,Quad PF,6+ Mos 01/08/2013  . Influenza-Unspecified 02/23/2015  . PFIZER SARS-COV-2 Vaccination 05/02/2019, 05/23/2019  . Td 08/08/2005, 07/07/2016  . Tdap 10/03/2015   4. Prostate cancer screening- low risk prior to transfer prostate cancer- Lab Results  Component Value Date   PSA 1.13 11/07/2018   PSA 0.94 11/04/2017   PSA 0.76 09/26/2015   5. Colon cancer screening - update PSA with blood work today ondue for colonoscopy as above September 2020-delayed due to COVID-19-he is agreeable to referral today 6. Skin cancer screening-follow-up with Dr. Ronnald Ramp of [facility]  dermatology- last visit 2 years ago-also manages rosacea (usually pretty good if avoids chocolate).  Advised regular sunscreen use. Denies worrisome, changing, or new skin lesions.  7.  Former smoker-over 30 pack years.  Enrolled in lung cancer screening-  programlast 01/2019 and appears to be ordered for this year.  Will need AAA scan next year 8. STD screening - not sexually active  Status of chronic or acute concerns   Other notes: 1. Long term friend in his 18s now living at [facility] (had a stroke and needing care) that he is POA for and spending a ton of time working at his home- over 200 bags of garbage taken out of home.    #hyperlipidemia/aortic atherosclerosis-incidental finding on CT lung cancer screening S: Medication: Rosuvastatin 20Mg - restart last year twice a week Lab Results  Component Value Date   CHOL 212 (H) 11/07/2018   HDL 61.80 11/07/2018  LDLCALC 115 (H) 11/04/2017   LDLDIRECT 71.0 11/07/2018   TRIG (H) 11/07/2018    513.0 Triglyceride is over 400; calculations on Lipids are invalid.   CHOLHDL 3 11/07/2018   A/P: Triglycerides were very high last year and restarted rosuvastatin.  Update lipid panel today -discussed cutting down on alcohol  #Gout S: 0 flares in last year. has Colchicine 0.6Mg  on hand.  Lab Results  Component Value Date   LABURIC 7.5 11/07/2018  A/P:reasonable control without uric acid lowering agent-as long as no gout flares we will not start allopurinol even if uric acid above goal of 6 or less  # GERD S: Omeperazole 20Mg .  Failed H2 blocker trial in the past B12 levels related to PPI use:  No results found for: VITAMINB12 A/P: Reasonable control-continue current medication.  Has failed H2 blocker in the past.  I have not recently checked a B12 level so we will check that  #hypertension S: medication: Bystolic 5 mg Home readings #s: not checking recently but has access at work  BP Readings from Last 3 Encounters:  11/08/19 140/90   07/19/19 (!) 153/38  11/07/18 138/84  A/P: Poor control of blood pressure today.  He agrees to do some monitoring at work and he will let me know if averaging over 138/88-may need to trial 10 mg of Bystolic . Update me in 1-2 weeks  #Insomnia-sparing Xanax-we will continue way with half tablet- does not need refill yet  #Allergies-tired on full dose antihistamine last year recommended half tablet of Claritin- having to take full tablet before bed.  Restarted Flonase last year. Still having some issues but not bad enough he wants to take more meds   #Fatty liver-incidental finding on prior imaging.  Update LFTs today.   Emphysema of lung ([place]) Incidental finding on CT lung cancer screening. Asymptomatic.  Continue to monitor   Recommended follow up: Return in about 6 months (around 05/10/2020) for follow up- or sooner if needed. 6 months with blood pressure being higher.  Lab/Order associations: fasting   ICD-10-CM   1. Preventative health care  Z00.00 CBC with Differential/Platelet    Comprehensive metabolic panel    Lipid panel    PSA    Uric acid    POCT Urinalysis Dipstick (Automated)    Vitamin B12  2. Essential hypertension  I10 Comprehensive metabolic panel  3. Gastroesophageal reflux disease without esophagitis  K21.9   4. Hyperlipidemia, unspecified hyperlipidemia type  E78.5 Lipid panel  5. Acute idiopathic gout of multiple sites  M10.09 Uric acid  6. Screening for prostate cancer  Z12.5 PSA  7. Former smoker  Z87.891 POCT Urinalysis Dipstick (Automated)  8. Insomnia, unspecified type  G47.00   9. Aortic atherosclerosis ([facility])  I70.0   10. High risk medication use  Z79.899 Vitamin B12  11. Screen for colon cancer  Z12.11   12. Pulmonary emphysema, unspecified emphysema type ([place])  J43.9   13. Fatty liver  K76.0     No orders of the defined types were placed in this encounter.   Return precautions advised.  Garret Reddish, MD

## 2019-11-06 NOTE — Patient Instructions (Addendum)
Please stop by lab before you go If you have mychart- we will send your results within 3 business days of [place] receiving them.  If you do not have mychart- we will call you about results within 5 business days of [place] receiving them.   Health Maintenance Due  Topic Date Due  . COLONOSCOPY -We will call you within two weeks about your referral to GI Dr. Fuller Plan. If you do not hear within 3 weeks, give [place] a call.   01/31/2019   Get updated visit with Dr. Ronnald Ramp  Poor control of blood pressure today.  He agrees to do some monitoring at work and he will let me know if averaging over 138/88-may need to trial 10 mg of Bystolic . Update me in 1-2 weeks

## 2019-11-08 ENCOUNTER — Ambulatory Visit (INDEPENDENT_AMBULATORY_CARE_PROVIDER_SITE_OTHER): Payer: 59 | Admitting: Family Medicine

## 2019-11-08 ENCOUNTER — Other Ambulatory Visit: Payer: Self-pay

## 2019-11-08 ENCOUNTER — Encounter: Payer: Self-pay | Admitting: Family Medicine

## 2019-11-08 VITALS — BP 140/90 | HR 70 | Temp 97.8°F | Ht 70.0 in | Wt 168.8 lb

## 2019-11-08 DIAGNOSIS — E785 Hyperlipidemia, unspecified: Secondary | ICD-10-CM | POA: Diagnosis not present

## 2019-11-08 DIAGNOSIS — K760 Fatty (change of) liver, not elsewhere classified: Secondary | ICD-10-CM | POA: Insufficient documentation

## 2019-11-08 DIAGNOSIS — Z0000 Encounter for general adult medical examination without abnormal findings: Secondary | ICD-10-CM | POA: Diagnosis not present

## 2019-11-08 DIAGNOSIS — G4700 Insomnia, unspecified: Secondary | ICD-10-CM

## 2019-11-08 DIAGNOSIS — Z79899 Other long term (current) drug therapy: Secondary | ICD-10-CM | POA: Diagnosis not present

## 2019-11-08 DIAGNOSIS — I700 Atherosclerosis of aorta: Secondary | ICD-10-CM | POA: Diagnosis not present

## 2019-11-08 DIAGNOSIS — K219 Gastro-esophageal reflux disease without esophagitis: Secondary | ICD-10-CM | POA: Diagnosis not present

## 2019-11-08 DIAGNOSIS — Z1211 Encounter for screening for malignant neoplasm of colon: Secondary | ICD-10-CM

## 2019-11-08 DIAGNOSIS — Z87891 Personal history of nicotine dependence: Secondary | ICD-10-CM | POA: Diagnosis not present

## 2019-11-08 DIAGNOSIS — Z125 Encounter for screening for malignant neoplasm of prostate: Secondary | ICD-10-CM | POA: Diagnosis not present

## 2019-11-08 DIAGNOSIS — I10 Essential (primary) hypertension: Secondary | ICD-10-CM

## 2019-11-08 DIAGNOSIS — M1009 Idiopathic gout, multiple sites: Secondary | ICD-10-CM

## 2019-11-08 DIAGNOSIS — D692 Other nonthrombocytopenic purpura: Secondary | ICD-10-CM | POA: Insufficient documentation

## 2019-11-08 DIAGNOSIS — J439 Emphysema, unspecified: Secondary | ICD-10-CM

## 2019-11-08 LAB — CBC WITH DIFFERENTIAL/PLATELET
Basophils Absolute: 0.1 10*3/uL (ref 0.0–0.1)
Basophils Relative: 1.1 % (ref 0.0–3.0)
Eosinophils Absolute: 0.4 10*3/uL (ref 0.0–0.7)
Eosinophils Relative: 8.1 % — ABNORMAL HIGH (ref 0.0–5.0)
HCT: 39.4 % (ref 39.0–52.0)
Hemoglobin: 13.6 g/dL (ref 13.0–17.0)
Lymphocytes Relative: 34.9 % (ref 12.0–46.0)
Lymphs Abs: 1.7 10*3/uL (ref 0.7–4.0)
MCHC: 34.5 g/dL (ref 30.0–36.0)
MCV: 91 fl (ref 78.0–100.0)
Monocytes Absolute: 0.4 10*3/uL (ref 0.1–1.0)
Monocytes Relative: 8.8 % (ref 3.0–12.0)
Neutro Abs: 2.2 10*3/uL (ref 1.4–7.7)
Neutrophils Relative %: 47.1 % (ref 43.0–77.0)
Platelets: 199 10*3/uL (ref 150.0–400.0)
RBC: 4.33 Mil/uL (ref 4.22–5.81)
RDW: 12.3 % (ref 11.5–15.5)
WBC: 4.8 10*3/uL (ref 4.0–10.5)

## 2019-11-08 LAB — COMPREHENSIVE METABOLIC PANEL
ALT: 25 U/L (ref 0–53)
AST: 26 U/L (ref 0–37)
Albumin: 4.1 g/dL (ref 3.5–5.2)
Alkaline Phosphatase: 49 U/L (ref 39–117)
BUN: 13 mg/dL (ref 6–23)
CO2: 29 mEq/L (ref 19–32)
Calcium: 8.9 mg/dL (ref 8.4–10.5)
Chloride: 100 mEq/L (ref 96–112)
Creatinine, Ser: 0.82 mg/dL (ref 0.40–1.50)
GFR: 94.49 mL/min (ref >60.00)
Glucose, Bld: 81 mg/dL (ref 70–99)
Potassium: 3.9 mEq/L (ref 3.5–5.1)
Sodium: 137 mEq/L (ref 135–145)
Total Bilirubin: 0.6 mg/dL (ref 0.2–1.2)
Total Protein: 6.9 g/dL (ref 6.0–8.3)

## 2019-11-08 LAB — POC URINALSYSI DIPSTICK (AUTOMATED)
Bilirubin, UA: NEGATIVE
Blood, UA: NEGATIVE
Glucose, UA: NEGATIVE
Ketones, UA: NEGATIVE
Leukocytes, UA: NEGATIVE
Nitrite, UA: NEGATIVE
Protein, UA: NEGATIVE
Spec Grav, UA: 1.015 (ref 1.010–1.025)
Urobilinogen, UA: 0.2 E.U./dL
pH, UA: 6 (ref 5.0–8.0)

## 2019-11-08 LAB — LIPID PANEL
Cholesterol: 166 mg/dL (ref 0–200)
HDL: 78 mg/dL (ref >39.00)
NonHDL: 88.29
Total CHOL/HDL Ratio: 2
Triglycerides: 211 mg/dL — ABNORMAL HIGH (ref 0.0–149.0)
VLDL: 42.2 mg/dL — ABNORMAL HIGH (ref 0.0–40.0)

## 2019-11-08 LAB — URIC ACID: Uric Acid, Serum: 7.4 mg/dL (ref 4.0–7.8)

## 2019-11-08 LAB — PSA: PSA: 0.78 ng/mL (ref 0.10–4.00)

## 2019-11-08 LAB — LDL CHOLESTEROL, DIRECT: Direct LDL: 61 mg/dL

## 2019-11-08 LAB — VITAMIN B12: Vitamin B-12: 215 pg/mL (ref 211–911)

## 2019-11-08 NOTE — Addendum Note (Signed)
Addended by: Doran Clay A on: 11/08/2019 09:12 AM   Modules accepted: Orders

## 2019-11-08 NOTE — Assessment & Plan Note (Signed)
Incidental finding on CT lung cancer screening. Asymptomatic.  Continue to monitor

## 2019-11-08 NOTE — Assessment & Plan Note (Signed)
Notes issues with easy bruising with thin skin. Update cbc. Monitor

## 2019-11-12 ENCOUNTER — Other Ambulatory Visit: Payer: Self-pay | Admitting: Family Medicine

## 2019-11-13 ENCOUNTER — Other Ambulatory Visit (HOSPITAL_COMMUNITY): Payer: Self-pay | Admitting: Family Medicine

## 2019-11-13 MED FILL — ROSUVASTATIN CALCIUM 20 MG: 20 | 84 days supply | Qty: 24 | Fill #0

## 2019-11-15 MED FILL — BYSTOLIC 5 MG TABLET: 5 | 90 days supply | Qty: 90 | Fill #0

## 2019-11-29 ENCOUNTER — Encounter: Payer: Self-pay | Admitting: Gastroenterology

## 2019-12-25 ENCOUNTER — Other Ambulatory Visit: Payer: Self-pay | Admitting: Family Medicine

## 2019-12-25 MED FILL — ALPRAZolam 0.5 MG TABS: 0.5 | 30 days supply | Qty: 30 | Fill #0

## 2019-12-25 MED FILL — OMEPRAZOLE DR 20 MG CAPSULE: 20 | 90 days supply | Qty: 90 | Fill #0

## 2019-12-25 NOTE — Telephone Encounter (Signed)
Pt requesting refills. Appt scheduled for Jan.

## 2019-12-28 ENCOUNTER — Other Ambulatory Visit: Payer: Self-pay

## 2019-12-28 ENCOUNTER — Ambulatory Visit (AMBULATORY_SURGERY_CENTER): Payer: Self-pay

## 2019-12-28 ENCOUNTER — Encounter: Payer: Self-pay | Admitting: Gastroenterology

## 2019-12-28 VITALS — Ht 70.0 in | Wt 168.0 lb

## 2019-12-28 DIAGNOSIS — Z1211 Encounter for screening for malignant neoplasm of colon: Secondary | ICD-10-CM

## 2019-12-28 MED ORDER — NA SULFATE-K SULFATE-MG SULF 17.5-3.13-1.6 GM/177ML PO SOLN: 1.0000 | Freq: Once | ORAL | 0 refills | Status: AC

## 2019-12-28 MED FILL — SUPREP BOWEL PREP KIT: 17.5-3.13-1 | 2 days supply | Qty: 354 | Fill #0

## 2019-12-28 NOTE — Progress Notes (Signed)
No egg or soy allergy known to patient  No issues with past sedation with any surgeries or procedures No intubation problems in the past  No FH of Malignant Hyperthermia No diet pills per patient No home 02 use per patient  No blood thinners per patient  Pt denies issues with constipation  No A fib or A flutter  COVID 19 guidelines implemented in PV today with Pt and RN  Coupon given to pt in PV today , Code to Pharmacy  COVID vaccines completed on 05/2019 per pt;  Due to the COVID-19 pandemic we are asking patients to follow these guidelines. Please only bring one care partner. Please be aware that your care partner may wait in the car in the parking lot or if they feel like they will be too hot to wait in the car, they may wait in the lobby on the 4th floor. All care partners are required to wear a mask the entire time (we do not have any that we can provide them), they need to practice social distancing, and we will do a Covid check for all patient's and care partners when you arrive. Also we will check their temperature and your temperature. If the care partner waits in their car they need to stay in the parking lot the entire time and we will call them on their cell phone when the patient is ready for discharge so they can bring the car to the front of the building. Also all patient's will need to wear a mask into building.

## 2020-01-22 ENCOUNTER — Other Ambulatory Visit: Payer: Self-pay | Admitting: Family Medicine

## 2020-01-22 ENCOUNTER — Other Ambulatory Visit (HOSPITAL_COMMUNITY): Payer: Self-pay | Admitting: Family Medicine

## 2020-01-22 MED FILL — COLCHICINE 0.6 MG TABS: 0.6 | 10 days supply | Qty: 10 | Fill #0

## 2020-01-23 ENCOUNTER — Ambulatory Visit (HOSPITAL_COMMUNITY)
Admission: EM | Admit: 2020-01-23 | Discharge: 2020-01-23 | Disposition: A | Discharge status: Home / Self Care | Payer: 59 | Attending: Family Medicine | Admitting: Family Medicine

## 2020-01-23 ENCOUNTER — Encounter (HOSPITAL_COMMUNITY): Payer: Self-pay

## 2020-01-23 ENCOUNTER — Other Ambulatory Visit: Payer: Self-pay

## 2020-01-23 DIAGNOSIS — M10071 Idiopathic gout, right ankle and foot: Secondary | ICD-10-CM

## 2020-01-23 HISTORY — DX: Gout, unspecified: M10.9

## 2020-01-23 MED ORDER — PREDNISONE 10 MG PO TABS: ORAL_TABLET | ORAL | 0 refills | Status: DC

## 2020-01-23 MED FILL — predniSONE 10 MG TABS: 10 | 6 days supply | Qty: 21 | Fill #0

## 2020-01-23 NOTE — ED Triage Notes (Signed)
Pt c/o right foot pain and attributes it to gout, which he has had before. States pain onset Saturday with swelling, took colchicine and states pain is not improving.  Pt reports left foot had gout flare-up to left foot approx 10 days ago, but resolved with colchicine. Pt states he has been drinking glass of red wine each night the past two weeks to improve his BP.

## 2020-01-23 NOTE — ED Provider Notes (Signed)
[facility]    CSN: [id number] Arrival date & time: 01/23/20  4098      History   Chief Complaint Chief Complaint  Patient presents with  . Foot Pain    HPI Angel Ellis is a 64 y.o. male.   Presenting today with 4 days of right foot pain, redness and swelling near base of great toe. Denies injury, fever, insect bite, decreased movement, numbness tingling. Has a hx of gout, with a recent flare in left foot about 10 days ago that resolved. Taking colchicine with mild improvement but not resolution of sxs. Notes only recent change since these flares is he started drinking a glass of red wine every evening to help lower his BP and has since stopped the last day or so when he read it can bring on gout flares. He does regularly take ACV and tart cherry supplements to try to prevent gout and avoid having to be on allopurinol. Has a PCP following his uric acid levels and bmp.      Past Medical History:  Diagnosis Date  . Allergy    seasonal allergies  . Diverticulitis of colon 10/29/2009   After august 1995    . Diverticulosis   . Emphysema of lung ([place])    not on any meds-past smoker  . GERD (gastroesophageal reflux disease)    on meds  . Gout   . Hypertension   . Lipoma of neck    L neck-remains in place  . Nasal congestion   . Sebaceous cyst 01/07/2009   R axilla      Patient Active Problem List   Diagnosis Date Noted  . Emphysema of lung ([place]) 11/08/2019  . Fatty liver 11/08/2019  . Senile purpura ([place]) 11/08/2019  . Aortic atherosclerosis ([place]) 11/07/2018  . Insomnia 08/23/2017  . Gout 07/23/2014  . Allergic rhinitis 07/23/2014  . Essential hypertension 07/23/2014  . Rosacea 07/23/2014  . 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 Basal cell carcinoma of skin 01/07/2009  . Former smoker 06/29/2007  . Hyperlipidemia 02/15/2007  . GERD 02/15/2007    Past Surgical History:  Procedure Laterality Date  . COLONOSCOPY  2010   TICS/hems  . none    . [place] Medications    Prior to Admission medications   Medication Sig Start Date End Date Taking? Authorizing Provider  ALPRAZolam Duanne Moron) 0.5 MG tablet TAKE 1 TABLET BY MOUTH DAILY AS NEEDED FOR SLEEP 12/25/19  Yes Marin Olp, MD  BYSTOLIC 5 MG tablet TAKE 1 TABLET BY MOUTH DAILY 11/13/19  Yes Marin Olp, MD  colchicine 0.6 MG tablet TAKE 1 TABLET BY MOUTH EVERY 1 TO 2 HOURS UNTIL ONE OF THE FOLLOWING OCCURS.1.THE PAIN IS GONE.OR 2.THE MAXIMUM DOSE GIVEN IS NO MORE THAN 3 01/22/20  Yes Marin Olp, MD  KRILL OIL PO Take by mouth.   Yes [provider]  MILK THISTLE PO Take by mouth.   Yes [provider]  Multiple Vitamin (MULTIVITAMIN PO) Take by mouth.   Yes [provider]  Multiple Vitamins-Minerals (EYE VITAMINS PO) Take by mouth.   Yes [provider]  omeprazole (PRILOSEC) 20 MG capsule TAKE 1 CAPSULE BY MOUTH DAILY. 12/25/19  Yes Marin Olp, MD  rosuvastatin (CRESTOR) 20 MG tablet TAKE 1 TABLET BY MOUTH 2 TIMES A WEEK. Patient taking differently: Tuesdays and Fridays 11/13/19  Yes Marin Olp, MD  saw palmetto 500  MG capsule Take 500 mg by mouth daily.   Yes [provider]  BIOTIN PO Take by mouth.    [provider]  BLACK ELDERBERRY PO Take by mouth.    [provider]  Cholecalciferol (D3-1000) 25 MCG (1000 UT) tablet Take 1,000 Units by mouth daily. Patient not taking: Reported on 12/28/2019    [provider]  Cyanocobalamin (B-12 PO) Take by mouth.    [provider]  fluticasone (FLONASE) 50 MCG/ACT nasal spray Place 2 sprays into both nostrils daily. Patient not taking: Reported on 12/28/2019 11/07/18   Marin Olp, MD  predniSONE (DELTASONE) 10 MG tablet Take 6 tabs day one, 5 tabs day two, 4 tabs day three, etc 01/23/20   Volney American, PA-C  tobramycin-dexamethasone [facility]) ophthalmic solution Place 1 drop into the left eye 4  (four) times daily. 09/21/19   [provider]  VITAMIN E PO Take by mouth.    [provider]    Family History Family History  Problem Relation Age of Onset  . Heart disease Mother        stopped psychiatric meds and BP meds, later with MI  . Hypertension Mother   . Schizophrenia Mother   . Heart disease Father        sleep apnea not controlled. 74.   . Colon polyps Neg Hx   . Colon cancer Neg Hx   . Esophageal cancer Neg Hx   . Rectal cancer Neg Hx   . Stomach cancer Neg Hx     Social History Social History   Tobacco Use  . Smoking status: Former Smoker    Packs/day: 1.00    Years: 37.00    Pack years: 37.00    Types: Cigarettes    Quit date: 2011    Years since quitting: 10.7  . Smokeless tobacco: Never Used  Vaping Use  . Vaping Use: Never used  Substance Use Topics  . Alcohol use: Yes    Alcohol/week: 21.0 standard drinks    Types: 21 Standard drinks or equivalent per week    Comment: one per day  . Drug use: No     Allergies   Aspirin, Milk-related compounds, and Penicillins   Review of Systems Review of Systems PER HPI    Physical Exam Triage Vital Signs ED Triage Vitals  Enc Vitals Group     BP 01/23/20 0917 (!) 136/93     Pulse Rate 01/23/20 0917 72     Resp 01/23/20 0917 18     Temp 01/23/20 0917 98.9 F (37.2 C)     Temp Source 01/23/20 0917 Oral     SpO2 01/23/20 0917 99 %     Weight --      Height --      Head Circumference --      Peak Flow --      Pain Score 01/23/20 0914 6     Pain Loc --      Pain Edu? --      Excl. in [place]? --    No data found.  Updated Vital Signs BP (!) 136/93 (BP Location: Right Arm)   Pulse 72   Temp 98.9 F (37.2 C) (Oral)   Resp 18   SpO2 99%   Visual Acuity Right Eye Distance:   Left Eye Distance:   Bilateral Distance:    Right Eye Near:   Left Eye Near:    Bilateral Near:     Physical Exam  Vitals and nursing note reviewed.  Constitutional:      Appearance: Normal  appearance.  HENT:     Head: Atraumatic.  Eyes:     Extraocular Movements: Extraocular movements intact.     Conjunctiva/sclera: Conjunctivae normal.  Cardiovascular:     Rate and Rhythm: Normal rate and regular rhythm.  Pulmonary:     Effort: Pulmonary effort is normal.     Breath sounds: Normal breath sounds.  Abdominal:     General: Bowel sounds are normal. There is no distension.     Palpations: Abdomen is soft.     Tenderness: There is no abdominal tenderness. There is no guarding.  Musculoskeletal:        General: Swelling (localized with ttp and mild erythema right foot base of great toe) present. No signs of injury. Normal range of motion.     Cervical back: Normal range of motion and neck supple.  Skin:    General: Skin is warm and dry.  Neurological:     General: No focal deficit present.     Mental Status: He is oriented to person, place, and time.  Psychiatric:        Mood and Affect: Mood normal.        Thought Content: Thought content normal.        Judgment: Judgment normal.      UC Treatments / Results  Labs (all labs ordered are listed, but only abnormal results are displayed) Labs Reviewed - No data to display  EKG   Radiology No results found.  Procedures Procedures (including critical care time)  Medications Ordered in UC Medications - No data to display  Initial Impression / Assessment and Plan / UC Course  I have reviewed the triage vital signs and the nursing notes.  Pertinent labs & imaging results that were available during my care of the patient were reviewed by me and considered in my medical decision making (see chart for details).     Consistent with gout flare, likely from red wine intake which patient has already self d/c'd. WIll continue home colchicine and add prednisone taper, continue supplements and diet modifications. F/u with PCP if not resolving fully. Work note given.   Final Clinical Impressions(s) / UC Diagnoses    Final diagnoses:  Acute idiopathic gout of right foot   Discharge Instructions   None    ED Prescriptions    Medication Sig Dispense Auth. Provider   predniSONE (DELTASONE) 10 MG tablet Take 6 tabs day one, 5 tabs day two, 4 tabs day three, etc 21 tablet Volney American, [place]     PDMP not reviewed this encounter.   Volney American, [place] 01/23/20 1315

## 2020-01-29 ENCOUNTER — Ambulatory Visit (AMBULATORY_SURGERY_CENTER): Payer: 59 | Admitting: Gastroenterology

## 2020-01-29 ENCOUNTER — Other Ambulatory Visit: Payer: Self-pay

## 2020-01-29 ENCOUNTER — Encounter: Payer: Self-pay | Admitting: Gastroenterology

## 2020-01-29 VITALS — BP 139/88 | HR 56 | Temp 96.8°F | Resp 16 | Ht 70.0 in | Wt 168.0 lb

## 2020-01-29 DIAGNOSIS — D125 Benign neoplasm of sigmoid colon: Secondary | ICD-10-CM | POA: Diagnosis not present

## 2020-01-29 DIAGNOSIS — D124 Benign neoplasm of descending colon: Secondary | ICD-10-CM

## 2020-01-29 DIAGNOSIS — Z1211 Encounter for screening for malignant neoplasm of colon: Secondary | ICD-10-CM | POA: Diagnosis not present

## 2020-01-29 MED ORDER — SODIUM CHLORIDE 0.9 % IV SOLN: 500.0000 mL | Freq: Once | INTRAVENOUS | Status: DC

## 2020-01-29 NOTE — Patient Instructions (Signed)
HANDOUTS PROVIDED ON: HIGH FIBER DIET, POLYPS, DIVERTICULOSIS, & HEMORRHOIDS  The polyps removed today have been sent for pathology.  The results can take 1-3 weeks to receive.  When your next colonoscopy should occur will be based on the pathology results.    You may resume your previous diet and medication schedule.  Thank you for allowing us to care for you today!!!   YOU HAD AN ENDOSCOPIC PROCEDURE TODAY AT THE [facility]:   Refer to the procedure report that was given to you for any specific questions about what was found during the examination.  If the procedure report does not answer your questions, please call your gastroenterologist to clarify.  If you requested that your care partner not be given the details of your procedure findings, then the procedure report has been included in a sealed envelope for you to review at your convenience later.  YOU SHOULD EXPECT: Some feelings of bloating in the abdomen. Passage of more gas than usual.  Walking can help get rid of the air that was put into your GI tract during the procedure and reduce the bloating. If you had a lower endoscopy (such as a colonoscopy or flexible sigmoidoscopy) you may notice spotting of blood in your stool or on the toilet paper. If you underwent a bowel prep for your procedure, you may not have a normal bowel movement for a few days.  Please Note:  You might notice some irritation and congestion in your nose or some drainage.  This is from the oxygen used during your procedure.  There is no need for concern and it should clear up in a day or so.  SYMPTOMS TO REPORT IMMEDIATELY:   Following lower endoscopy (colonoscopy or flexible sigmoidoscopy):  Excessive amounts of blood in the stool  Significant tenderness or worsening of abdominal pains  Swelling of the abdomen that is new, acute  Fever of 100F or higher  For urgent or emergent issues, a gastroenterologist can be reached at any hour by calling  [phone]. Do not use MyChart messaging for urgent concerns.    DIET:  We do recommend a small meal at first, but then you may proceed to your regular diet.  Drink plenty of fluids but you should avoid alcoholic beverages for 24 hours.  ACTIVITY:  You should plan to take it easy for the rest of today and you should NOT DRIVE or use heavy machinery until tomorrow (because of the sedation medicines used during the test).    FOLLOW UP: Our staff will call the number listed on your records 48-72 hours following your procedure to check on you and address any questions or concerns that you may have regarding the information given to you following your procedure. If we do not reach you, we will leave a message.  We will attempt to reach you two times.  During this call, we will ask if you have developed any symptoms of COVID 19. If you develop any symptoms (ie: fever, flu-like symptoms, shortness of breath, cough etc.) before then, please call [phone].  If you test positive for Covid 19 in the 2 weeks post procedure, please call and report this information to us.    If any biopsies were taken you will be contacted by phone or by letter within the next 1-3 weeks.  Please call us at [phone] if you have not heard about the biopsies in 3 weeks.    SIGNATURES/CONFIDENTIALITY: You and/or your care partner have signed   paperwork which will be entered into your electronic medical record.  These signatures attest to the fact that that the information above on your After Visit Summary has been reviewed and is understood.  Full responsibility of the confidentiality of this discharge information lies with you and/or your care-partner.

## 2020-01-29 NOTE — Progress Notes (Signed)
PT taken to PACU. Monitors in place. VSS. Report given to RN. 

## 2020-01-29 NOTE — Op Note (Signed)
[facility] Patient Name: Angel Ellis Procedure Date: 01/29/2020 11:04 AM MRN: [id number] Endoscopist: Ladene Artist , MD Age: 64 Referring MD:  Date of Birth: [birth year] Gender: Male Account #: [id number] Procedure:                Colonoscopy Indications:              Screening for colorectal malignant neoplasm Medicines:                Monitored Anesthesia Care Procedure:                Pre-Anesthesia Assessment:                           - Prior to the procedure, a History and Physical                            was performed, and patient medications and                            allergies were reviewed. The patient's tolerance of                            previous anesthesia was also reviewed. The risks                            and benefits of the procedure and the sedation                            options and risks were discussed with the patient.                            All questions were answered, and informed consent                            was obtained. Prior Anticoagulants: The patient has                            taken no previous anticoagulant or antiplatelet                            agents. ASA Grade Assessment: II - A patient with                            mild systemic disease. After reviewing the risks                            and benefits, the patient was deemed in                            satisfactory condition to undergo the procedure.                           After obtaining informed consent, the colonoscope  was passed under direct vision. Throughout the                            procedure, the patient's blood pressure, pulse, and                            oxygen saturations were monitored continuously. The                            Colonoscope was introduced through the anus and                            advanced to the the cecum, identified by                            appendiceal orifice and  ileocecal valve. The                            ileocecal valve, appendiceal orifice, and rectum                            were photographed. The quality of the bowel                            preparation was good. The colonoscopy was performed                            without difficulty. The patient tolerated the                            procedure well. Scope In: 11:08:14 AM Scope Out: 11:25:58 AM Scope Withdrawal Time: 0 hours 14 minutes 59 seconds  Total Procedure Duration: 0 hours 17 minutes 44 seconds  Findings:                 The perianal and digital rectal examinations were                            normal.                           Three sessile polyps were found in the sigmoid                            colon (1) and descending colon (2). The polyps were                            6 to 7 mm in size. These polyps were removed with a                            cold snare. Resection and retrieval were complete.                           A few medium-mouthed diverticula were found in the  ascending colon. There was no evidence of                            diverticular bleeding.                           Multiple small-mouthed diverticula were found in                            the left colon. There was no evidence of                            diverticular bleeding.                           Internal hemorrhoids were found during                            retroflexion. The hemorrhoids were small and Grade                            I (internal hemorrhoids that do not prolapse).                           The exam was otherwise without abnormality on                            direct and retroflexion views. Complications:            No immediate complications. Estimated blood loss:                            None. Estimated Blood Loss:     Estimated blood loss: none. Impression:               - Three 6 to 7 mm polyps in the sigmoid colon and                             in the descending colon, removed with a cold snare.                            Resected and retrieved.                           - Mild ascending colon diverticulosis.                           - Moderate diverticulosis in the left colon.                           - Internal hemorrhoids.                           - The examination was otherwise normal on direct  and retroflexion views. Recommendation:           - Repeat colonoscopy date to be determined after                            pending pathology results are reviewed for                            surveillance based on pathology results.                           - Patient has a contact number available for                            emergencies. The signs and symptoms of potential                            delayed complications were discussed with the                            patient. Return to normal activities tomorrow.                            Written discharge instructions were provided to the                            patient.                           - High fiber diet.                           - Continue present medications.                           - Await pathology results. Ladene Artist, MD 01/29/2020 11:30:23 AM This report has been signed electronically.

## 2020-01-29 NOTE — Progress Notes (Signed)
Called to room to assist during endoscopic procedure.  Patient ID and intended procedure confirmed with present staff. Received instructions for my participation in the procedure from the performing physician.  

## 2020-01-29 NOTE — Progress Notes (Signed)
VS by S.Hodges  Pt's states no medical or surgical changes since previsit or office visit.

## 2020-01-31 ENCOUNTER — Telehealth: Payer: Self-pay

## 2020-01-31 NOTE — Telephone Encounter (Signed)
1st follow up call made.  NALM 

## 2020-01-31 NOTE — Telephone Encounter (Signed)
2nd follow up call made.  NALM 

## 2020-02-01 ENCOUNTER — Other Ambulatory Visit: Payer: Self-pay

## 2020-02-01 ENCOUNTER — Ambulatory Visit (INDEPENDENT_AMBULATORY_CARE_PROVIDER_SITE_OTHER)
Admission: RE | Admit: 2020-02-01 | Discharge: 2020-02-01 | Disposition: A | Discharge status: Home / Self Care | Payer: 59 | Source: Ambulatory Visit | Attending: Acute Care | Admitting: Acute Care

## 2020-02-01 DIAGNOSIS — Z122 Encounter for screening for malignant neoplasm of respiratory organs: Secondary | ICD-10-CM

## 2020-02-01 DIAGNOSIS — Z87891 Personal history of nicotine dependence: Secondary | ICD-10-CM

## 2020-02-04 MED FILL — ALPRAZolam 0.5 MG TABS: 0.5 | 30 days supply | Qty: 30 | Fill #1

## 2020-02-05 MED FILL — ROSUVASTATIN CALCIUM 20 MG: 20 | 84 days supply | Qty: 24 | Fill #1

## 2020-02-05 NOTE — Progress Notes (Signed)

## 2020-02-06 ENCOUNTER — Other Ambulatory Visit: Payer: Self-pay | Admitting: *Deleted

## 2020-02-06 DIAGNOSIS — Z87891 Personal history of nicotine dependence: Secondary | ICD-10-CM

## 2020-02-07 ENCOUNTER — Encounter: Payer: Self-pay | Admitting: Gastroenterology

## 2020-02-11 ENCOUNTER — Other Ambulatory Visit: Payer: Self-pay | Admitting: Family Medicine

## 2020-02-11 ENCOUNTER — Other Ambulatory Visit (HOSPITAL_COMMUNITY): Payer: Self-pay | Admitting: Family Medicine

## 2020-02-11 MED FILL — NEBIVOLOL HCL 5 MG TABS: 5 | 90 days supply | Qty: 90 | Fill #0

## 2020-03-12 ENCOUNTER — Other Ambulatory Visit (HOSPITAL_COMMUNITY): Payer: Self-pay | Admitting: Emergency Medicine

## 2020-03-12 ENCOUNTER — Ambulatory Visit (HOSPITAL_COMMUNITY)
Admission: EM | Admit: 2020-03-12 | Discharge: 2020-03-12 | Disposition: A | Discharge status: Home / Self Care | Payer: 59 | Attending: Emergency Medicine | Admitting: Emergency Medicine

## 2020-03-12 ENCOUNTER — Encounter (HOSPITAL_COMMUNITY): Payer: Self-pay

## 2020-03-12 DIAGNOSIS — M10071 Idiopathic gout, right ankle and foot: Secondary | ICD-10-CM | POA: Diagnosis not present

## 2020-03-12 MED ORDER — PREDNISONE 10 MG PO TABS: ORAL_TABLET | ORAL | 0 refills | Status: DC

## 2020-03-12 MED FILL — predniSONE 10 MG TABS: 10 | 6 days supply | Qty: 21 | Fill #0

## 2020-03-12 NOTE — Discharge Instructions (Signed)
Begin prednisone taper  Elevate foot to help with swelling Follow up with primary care for recurrent gout/elevated blood pressure

## 2020-03-12 NOTE — ED Triage Notes (Signed)
Pt presents with pain and swelling in the right big toe x 3 days. States the pain is related to gout, started 3 days ago, after he had 1 glass of red wine. 1 tablet of colchicine "not working as is used to" . Pain is worse when walking.

## 2020-03-12 NOTE — ED Provider Notes (Signed)
[facility]    CSN: [id number] Arrival date & time: 03/12/20  5621      History   Chief Complaint Chief Complaint  Patient presents with   Foot Pain    HPI Angel Ellis is a 64 y.o. male history of GERD, gout, hypertension presenting today for evaluation of foot pain.  Reports pain to his right great toe for the past 3 to 4 days.  Feels similar to prior gout.  Reports associated swelling.  Taking colchicine without full relief. Had gout flare of right foot on 9/15, took 6-day prednisone taper.  Reports significant improvement in symptoms with the prednisone.  Denies history of diabetes.  Denies any injury or trauma.  HPI  Past Medical History:  Diagnosis Date   Allergy    seasonal allergies   Diverticulitis of colon 10/29/2009   After august 1995     Diverticulosis    Emphysema of lung ([place])    not on any meds-past smoker   GERD (gastroesophageal reflux disease)    on meds   Gout    Hypertension    Lipoma of neck    L neck-remains in place   Nasal congestion    Sebaceous cyst 01/07/2009   R axilla      Patient Active Problem List   Diagnosis Date Noted   Emphysema of lung ([place]) 11/08/2019   Fatty liver 11/08/2019   Senile purpura ([place]) 11/08/2019   Aortic atherosclerosis ([employer]) 11/07/2018   Insomnia 08/23/2017   Gout 07/23/2014   Allergic rhinitis 07/23/2014   Essential hypertension 07/23/2014   Rosacea 07/23/2014   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 Authorizing Provider  ALPRAZolam Duanne Moron) 0.5 MG tablet TAKE 1 TABLET BY MOUTH DAILY AS NEEDED FOR SLEEP 12/25/19   Marin Olp,  MD  BIOTIN PO Take by mouth.    [provider]  BLACK ELDERBERRY PO Take by mouth.    [provider]  BYSTOLIC 5 MG tablet TAKE 1 TABLET BY MOUTH DAILY 02/11/20   Marin Olp, MD  colchicine 0.6 MG tablet TAKE 1 TABLET BY MOUTH EVERY 1 TO 2 HOURS UNTIL ONE OF THE FOLLOWING OCCURS.1.THE PAIN IS GONE.OR 2.THE MAXIMUM DOSE GIVEN IS NO MORE THAN 3 01/22/20   Marin Olp, MD  Cyanocobalamin (B-12 PO) Take by mouth.    [provider]  KRILL OIL PO Take by mouth.    [provider]  MILK THISTLE PO Take by mouth.    [provider]  Multiple Vitamin (MULTIVITAMIN PO) Take by mouth.    [provider]  Multiple Vitamins-Minerals (EYE VITAMINS PO) Take by mouth.    [provider]  omeprazole (PRILOSEC) 20 MG capsule TAKE 1 CAPSULE BY MOUTH DAILY. 12/25/19   Marin Olp, MD  predniSONE (DELTASONE) 10 MG tablet Begin with 6 tabs on day 1, 5 tab on day 2, 4 tab on day 3, 3 tab on day 4, 2 tab on day 5, 1 tab on day 6-take with food 03/12/20   Maurilio Puryear, [place] C, PA-C  rosuvastatin (  CRESTOR) 20 MG tablet TAKE 1 TABLET BY MOUTH 2 TIMES A WEEK. Patient taking differently: Tuesdays and Fridays 11/13/19   Marin Olp, MD  saw palmetto 500 MG capsule Take 500 mg by mouth daily.    [provider]  tobramycin-dexamethasone Baird Cancer) ophthalmic solution Place 1 drop into the left eye 4 (four) times daily. 09/21/19   [provider]  VITAMIN E PO Take by mouth.    [provider]  fluticasone (FLONASE) 50 MCG/ACT nasal spray Place 2 sprays into both nostrils daily. Patient not taking: Reported on 12/28/2019 11/07/18 03/12/20  Marin Olp, MD    Family History Family History  Problem Relation Age of Onset   Heart disease Mother        stopped psychiatric meds and BP meds, later with MI   Hypertension Mother    Schizophrenia Mother    Heart disease Father        sleep apnea not controlled. 74.     Colon polyps Neg Hx    Colon cancer Neg Hx    Esophageal cancer Neg Hx    Rectal cancer Neg Hx    Stomach cancer Neg Hx     Social History Social History   Tobacco Use   Smoking status: Former Smoker    Packs/day: 1.00    Years: 37.00    Pack years: 37.00    Types: Cigarettes    Quit date: 2011    Years since quitting: 10.8   Smokeless tobacco: Never Used  Scientific laboratory technician Use: Never used  Substance Use Topics   Alcohol use: Yes    Alcohol/week: 21.0 standard drinks    Types: 21 Standard drinks or equivalent per week    Comment: one per day   Drug use: No     Allergies   Aspirin, Milk-related compounds, and Penicillins   Review of Systems Review of Systems  Constitutional: Negative for fatigue and fever.  Eyes: Negative for redness, itching and visual disturbance.  Respiratory: Negative for shortness of breath.   Cardiovascular: Negative for chest pain and leg swelling.  Gastrointestinal: Negative for nausea and vomiting.  Musculoskeletal: Positive for arthralgias. Negative for myalgias.  Skin: Negative for color change, rash and wound.  Neurological: Negative for dizziness, syncope, weakness, light-headedness and headaches.     Physical Exam Triage Vital Signs ED Triage Vitals  Enc Vitals Group     BP      Pulse      Resp      Temp      Temp src      SpO2      Weight      Height      Head Circumference      Peak Flow      Pain Score      Pain Loc      Pain Edu?      Excl. in [place]?    No data found.  Updated Vital Signs BP (!) 145/98 (BP Location: Right Arm)    Pulse 65    Temp 98.3 F (36.8 C) (Oral)    Resp 18    SpO2 98%   Visual Acuity Right Eye Distance:   Left Eye Distance:   Bilateral Distance:    Right Eye Near:   Left Eye Near:    Bilateral Near:     Physical Exam Vitals and nursing note reviewed.  Constitutional:      Appearance: He is well-developed.  Comments: No acute distress  HENT:     Head:  Normocephalic and atraumatic.     Nose: Nose normal.  Eyes:     Conjunctiva/sclera: Conjunctivae normal.  Cardiovascular:     Rate and Rhythm: Normal rate.  Pulmonary:     Effort: Pulmonary effort is normal. No respiratory distress.  Abdominal:     General: There is no distension.  Musculoskeletal:        General: Normal range of motion.     Cervical back: Neck supple.     Comments: Right foot mild erythema warmth and swelling noted to base of right great toe Dorsalis pedis 2+  Skin:    General: Skin is warm and dry.  Neurological:     Mental Status: He is alert and oriented to person, place, and time.      UC Treatments / Results  Labs (all labs ordered are listed, but only abnormal results are displayed) Labs Reviewed - No data to display  EKG   Radiology No results found.  Procedures Procedures (including critical care time)  Medications Ordered in UC Medications - No data to display  Initial Impression / Assessment and Plan / UC Course  I have reviewed the triage vital signs and the nursing notes.  Pertinent labs & imaging results that were available during my care of the patient were reviewed by me and considered in my medical decision making (see chart for details).     Gout flare of right great toe-treating with repeat prednisone taper x6 days, elevate, follow-up with PCP for recurrent gout to discuss prevention if needed.  Discussed strict return precautions. Patient verbalized understanding and is agreeable with plan.  Final Clinical Impressions(s) / UC Diagnoses   Final diagnoses:  Acute idiopathic gout involving toe of right foot     Discharge Instructions     Begin prednisone taper  Elevate foot to help with swelling Follow up with primary care for recurrent gout/elevated blood pressure    ED Prescriptions    Medication Sig Dispense Auth. Provider   predniSONE (DELTASONE) 10 MG tablet Begin with 6 tabs on day 1, 5 tab on day 2, 4 tab on  day 3, 3 tab on day 4, 2 tab on day 5, 1 tab on day 6-take with food 21 tablet Lennex Pietila C, PA-C     PDMP not reviewed this encounter.   Janith Lima, [place] 03/12/20 [phone]

## 2020-03-19 ENCOUNTER — Other Ambulatory Visit: Payer: Self-pay | Admitting: Family Medicine

## 2020-03-19 ENCOUNTER — Other Ambulatory Visit (HOSPITAL_COMMUNITY): Payer: Self-pay | Admitting: Family Medicine

## 2020-03-19 MED FILL — OMEPRAZOLE DR 20 MG CAPSULE: 20 | 90 days supply | Qty: 90 | Fill #0

## 2020-03-19 MED FILL — ALPRAZolam 0.5 MG TABS: 0.5 | 30 days supply | Qty: 30 | Fill #2

## 2020-03-19 MED FILL — COLCHICINE 0.6 MG TABS: 0.6 | 10 days supply | Qty: 10 | Fill #1

## 2020-04-28 ENCOUNTER — Other Ambulatory Visit (HOSPITAL_COMMUNITY): Payer: Self-pay | Admitting: Family Medicine

## 2020-04-28 ENCOUNTER — Encounter (HOSPITAL_COMMUNITY): Payer: Self-pay

## 2020-04-28 ENCOUNTER — Ambulatory Visit (HOSPITAL_COMMUNITY)
Admission: EM | Admit: 2020-04-28 | Discharge: 2020-04-28 | Disposition: A | Discharge status: Home / Self Care | Payer: 59 | Attending: Family Medicine | Admitting: Family Medicine

## 2020-04-28 DIAGNOSIS — M109 Gout, unspecified: Secondary | ICD-10-CM | POA: Diagnosis not present

## 2020-04-28 MED ORDER — PREDNISONE 10 MG (21) PO TBPK: ORAL_TABLET | ORAL | 0 refills | Status: DC

## 2020-04-28 MED FILL — predniSONE 10 MG TABS: 10 | 6 days supply | Qty: 21 | Fill #0

## 2020-04-28 NOTE — Discharge Instructions (Addendum)
Prednisone as prescribed for gout.  Follow up as needed for continued or worsening symptoms

## 2020-04-28 NOTE — ED Triage Notes (Signed)
Pt presents with gout flare up in right foot since yesterday; pt states prescribed medication is not giving him any relief.

## 2020-04-28 NOTE — ED Provider Notes (Signed)
[place]    CSN: [id number] Arrival date & time: 04/28/20  0801      History   Chief Complaint Chief Complaint  Patient presents with  . Gout    HPI Angel Ellis is a 64 y.o. male.   Patient is a 64 year old male presents today with possible gout flare.  This is been present since yesterday.  Colchicine is not helping.  Believe it is related to his diet from this weekend.  No injuries to the foot, fevers.     Past Medical History:  Diagnosis Date  . Allergy    seasonal allergies  . Diverticulitis of colon 10/29/2009   After august 1995    . Diverticulosis   . Emphysema of lung ([place])    not on any meds-past smoker  . GERD (gastroesophageal reflux disease)    on meds  . Gout   . Hypertension   . Lipoma of neck    L neck-remains in place  . Nasal congestion   . Sebaceous cyst 01/07/2009   R axilla      Patient Active Problem List   Diagnosis Date Noted  . Emphysema of lung ([place]) 11/08/2019  . Fatty liver 11/08/2019  . Senile purpura ([place]) 11/08/2019  . Aortic atherosclerosis ([place]) 11/07/2018  . Insomnia 08/23/2017  . Gout 07/23/2014  . Allergic rhinitis 07/23/2014  . Essential hypertension 07/23/2014  . Rosacea 07/23/2014  . 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 Basal cell carcinoma of skin 01/07/2009  . Former smoker 06/29/2007  . Hyperlipidemia 02/15/2007  . GERD 02/15/2007    Past Surgical History:  Procedure Laterality Date  . COLONOSCOPY  2010   TICS/hems  . none    . [place] Medications    Prior to Admission medications   Medication Sig Start Date End Date Taking? Authorizing Provider  ALPRAZolam Duanne Moron) 0.5 MG tablet TAKE 1 TABLET BY MOUTH DAILY AS NEEDED FOR SLEEP 12/25/19   Marin Olp, MD  BIOTIN PO Take by mouth.    [provider]  BLACK ELDERBERRY PO Take by mouth.    [provider]  BYSTOLIC 5 MG tablet TAKE 1 TABLET BY MOUTH DAILY 02/11/20   Marin Olp, MD   colchicine 0.6 MG tablet TAKE 1 TABLET BY MOUTH EVERY 1 TO 2 HOURS UNTIL ONE OF THE FOLLOWING OCCURS.1.THE PAIN IS GONE.OR 2.THE MAXIMUM DOSE GIVEN IS NO MORE THAN 3 01/22/20   Marin Olp, MD  Cyanocobalamin (B-12 PO) Take by mouth.    [provider]  KRILL OIL PO Take by mouth.    [provider]  MILK THISTLE PO Take by mouth.    [provider]  Multiple Vitamin (MULTIVITAMIN PO) Take by mouth.    [provider]  Multiple Vitamins-Minerals (EYE VITAMINS PO) Take by mouth.    [provider]  omeprazole (PRILOSEC) 20 MG capsule TAKE 1 CAPSULE BY MOUTH DAILY. 03/19/20   Marin Olp, MD  predniSONE (STERAPRED UNI-PAK 21 TAB) 10 MG (21) TBPK tablet 6 tabs for 1 day, then 5 tabs for 1 das, then 4 tabs for 1 day, then 3 tabs for 1 day, 2 tabs for 1 day, then 1 tab for 1 day 04/28/20   Loura Halt A, NP  rosuvastatin (CRESTOR) 20 MG tablet TAKE 1 TABLET BY MOUTH 2 TIMES A WEEK. Patient taking differently: Tuesdays and Fridays 11/13/19   Marin Olp, MD  saw palmetto 500 MG capsule Take 500 mg by mouth daily.    [provider]  tobramycin-dexamethasone [facility]) ophthalmic solution Place 1 drop into the left eye 4 (four) times daily. 09/21/19   [provider]  VITAMIN E PO Take by mouth.    [provider]  fluticasone (FLONASE) 50 MCG/ACT nasal spray Place 2 sprays into both nostrils daily. Patient not taking: Reported on 12/28/2019 11/07/18 03/12/20  Marin Olp, MD    Family History Family History  Problem Relation Age of Onset  . Heart disease Mother        stopped psychiatric meds and BP meds, later with MI  . Hypertension Mother   . Schizophrenia Mother   . Heart disease Father        sleep apnea not controlled. 74.   . Colon polyps Neg Hx   . Colon cancer Neg Hx   . Esophageal cancer Neg Hx   . Rectal cancer Neg Hx   . Stomach cancer Neg Hx     Social History Social History   Tobacco  Use  . Smoking status: Former Smoker    Packs/day: 1.00    Years: 37.00    Pack years: 37.00    Types: Cigarettes    Quit date: 2011    Years since quitting: 10.9  . Smokeless tobacco: Never Used  Vaping Use  . Vaping Use: Never used  Substance Use Topics  . Alcohol use: Yes    Alcohol/week: 21.0 standard drinks    Types: 21 Standard drinks or equivalent per week    Comment: one per day  . Drug use: No     Allergies   Aspirin, Milk-related compounds, and Penicillins   Review of Systems Review of Systems   Physical Exam Triage Vital Signs ED Triage Vitals  Enc Vitals Group     BP 04/28/20 0831 (!) 149/97     Pulse Rate 04/28/20 0831 81     Resp 04/28/20 0831 17     Temp 04/28/20 0831 98.6 F (37 C)     Temp Source 04/28/20 0831 Oral     SpO2 04/28/20 0831 97 %     Weight --      Height --      Head Circumference --      Peak Flow --      Pain Score 04/28/20 0830 9     Pain Loc --      Pain Edu? --      Excl. in [place]? --    No data found.  Updated Vital Signs BP (!) 149/97 (BP Location: Right Arm)   Pulse 81   Temp 98.6 F (37 C) (Oral)   Resp 17   SpO2 97%   Visual Acuity Right Eye Distance:   Left Eye Distance:   Bilateral Distance:    Right Eye Near:   Left Eye Near:    Bilateral Near:     Physical Exam Vitals and nursing note reviewed.  Constitutional:      Appearance: Normal appearance.  HENT:     Head: Normocephalic and atraumatic.  Eyes:     Conjunctiva/sclera: Conjunctivae normal.  Pulmonary:     Effort: Pulmonary effort is normal.  Abdominal:     Palpations: Abdomen is soft.  Musculoskeletal:        General: Normal range of motion.     Cervical back: Normal range of motion.     Comments: Swelling, redness to the right great toe  and  foot. Very tender.   Skin:    General: Skin is warm and dry.  Neurological:     Mental Status: He is alert.  Psychiatric:        Mood and Affect: Mood normal.      UC Treatments / Results   Labs (all labs ordered are listed, but only abnormal results are displayed) Labs Reviewed - No data to display  EKG   Radiology No results found.  Procedures Procedures (including critical care time)  Medications Ordered in UC Medications - No data to display  Initial Impression / Assessment and Plan / UC Course  I have reviewed the triage vital signs and the nursing notes.  Pertinent labs & imaging results that were available during my care of the patient were reviewed by me and considered in my medical decision making (see chart for details).     Gout Treated with prednisone taper.  Medications prescribed. Follow up as needed for continued or worsening symptoms  Final Clinical Impressions(s) / UC Diagnoses   Final diagnoses:  Acute gout of right foot, unspecified cause     Discharge Instructions     Prednisone as prescribed for gout.  Follow up as needed for continued or worsening symptoms     ED Prescriptions    Medication Sig Dispense Auth. Provider   predniSONE (STERAPRED UNI-PAK 21 TAB) 10 MG (21) TBPK tablet 6 tabs for 1 day, then 5 tabs for 1 das, then 4 tabs for 1 day, then 3 tabs for 1 day, 2 tabs for 1 day, then 1 tab for 1 day 21 tablet Carlito Bogert A, NP     PDMP not reviewed this encounter.   [place] July, NP 04/28/20 1137

## 2020-05-10 NOTE — Patient Instructions (Incomplete)
Health Maintenance Due  Topic Date Due  . INFLUENZA VACCINE - please call us back with date of the flu shot  12/09/2019   Please stop by lab before you go If you have mychart- we will send your results within 3 business days of [place] receiving them.  If you do not have mychart- we will call you about results within 5 business days of [place] receiving them.  *please also note that you will see labs on mychart as soon as they post. I will later go in and write notes on them- will say "notes from Dr. Durene Cal"

## 2020-05-10 NOTE — Progress Notes (Signed)
Phone [phone] In person visit   Subjective:   Angel Ellis is a 65 y.o. year old very pleasant male patient who presents for/with See problem oriented charting Chief Complaint  Patient presents with  . Hyperlipidemia  . Hypertension   This visit occurred during the SARS-CoV-2 public health emergency.  Safety protocols were in place, including screening questions prior to the visit, additional usage of staff PPE, and extensive cleaning of exam room while observing appropriate contact time as indicated for disinfecting solutions.   Past Medical History-  Patient Active Problem List   Diagnosis Date Noted  . Aortic atherosclerosis ([place]) 11/07/2018    Priority: Medium  . Insomnia 08/23/2017    Priority: Medium  . Gout 07/23/2014    Priority: Medium  . Essential hypertension 07/23/2014    Priority: Medium  . 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 Former smoker 06/29/2007    Priority: Medium  . Hyperlipidemia 02/15/2007    Priority: Medium  . Allergic rhinitis 07/23/2014    Priority: Low  . Rosacea 07/23/2014    Priority: Low  . Basal cell carcinoma of skin 01/07/2009    Priority: Low  . GERD 02/15/2007    Priority: Low  . B12 deficiency 05/12/2020  . Emphysema of lung ([place]) 11/08/2019  . Fatty liver 11/08/2019  . Senile purpura ([place]) 11/08/2019    Medications- reviewed and updated Current Outpatient Medications  Medication Sig Dispense Refill  . BIOTIN PO Take by mouth.    . BYSTOLIC 5 MG tablet TAKE 1 TABLET BY MOUTH DAILY 90 tablet 0  . KRILL OIL PO Take by mouth.    Marland Kitchen MILK THISTLE PO Take by mouth.    . Multiple Vitamin (MULTIVITAMIN PO) Take by mouth.    . Multiple Vitamins-Minerals (EYE VITAMINS PO) Take by mouth.    Marland Kitchen omeprazole (PRILOSEC) 20 MG capsule TAKE 1 CAPSULE BY MOUTH DAILY. 90 capsule 0  . rosuvastatin (CRESTOR) 20 MG tablet TAKE 1 TABLET BY MOUTH 2 TIMES A WEEK. (Patient taking differently: Tuesdays and Fridays) 24 tablet 3  . saw  palmetto 500 MG capsule Take 500 mg by mouth daily.    Marland Kitchen UNABLE TO FIND Urcinol-Uric acid Management    . ALPRAZolam (XANAX) 0.5 MG tablet Take 1 tablet (0.5 mg total) by mouth at bedtime as needed for anxiety. 30 tablet 2  . BLACK ELDERBERRY PO Take by mouth. (Patient not taking: Reported on 05/12/2020)    . colchicine 0.6 MG tablet TAKE 1 TABLET BY MOUTH EVERY 1 TO 2 HOURS UNTIL ONE OF THE FOLLOWING OCCURS.1.THE PAIN IS GONE.OR 2.THE MAXIMUM DOSE GIVEN IS NO MORE THAN 3 (Patient not taking: Reported on 05/12/2020) 10 tablet 5  . Cyanocobalamin (B-12 PO) Take by mouth.    . diclofenac (VOLTAREN) 75 MG EC tablet Take 1 tablet (75 mg total) by mouth 2 (two) times daily as needed (bilateral foot pain). 14 tablet 0  . tobramycin-dexamethasone (TOBRADEX) ophthalmic solution Place 1 drop into the left eye 4 (four) times daily. (Patient not taking: Reported on 05/12/2020)    . VITAMIN E PO Take by mouth.     No current facility-administered medications for this visit.     Objective:  BP 130/80   Pulse 67   Temp 97.7 F (36.5 C) (Temporal)   Ht 5\' 10"  (1.778 m)   Wt 175 lb 3.2 oz (79.5 kg)   SpO2 99%   BMI 25.14 kg/m  Gen: NAD, resting comfortably CV: RRR  no murmurs rubs or gallops Lungs: CTAB no crackles, wheeze, rhonchi Ext: no edema Skin: warm, dry Neuro: grossly normal, moves all extremities     Assessment and Plan   #hypertension S: medication: Bystolic 5Mg  Home readings #s: average 130.7/86.1 BP Readings from Last 3 Encounters:  05/12/20 130/80  04/28/20 (!) 149/97  03/12/20 (!) 145/98  A/P: reasonable control- continue current medication.   #hyperlipidemia/aortic atherosclerosis S: Medication: Rosuvastatin 20Mg  twice weekly Lab Results  Component Value Date   CHOL 166 11/08/2019   HDL 78.00 11/08/2019   LDLCALC 115 (H) 11/04/2017   LDLDIRECT 61.0 11/08/2019   TRIG 211.0 (H) 11/08/2019   CHOLHDL 2 11/08/2019   A/P: LDL is very well controlled on rosuvastatin 20 mg twice  weekly-below goal of 70 or less for aortic atherosclerosis.  Continue to work on reducing triglycerides 3 healthy eating/regular exercise  #Gout S: 30 flares in 6 months.  Has Colchicine 0.6Mg  but has not adequately treated flares x3-has required prednisone. Thinks first was related to red wine, 2nd time related to cranberry sauce. Urcinol OTC- has taken for  Lab Results  Component Value Date   LABURIC 7.4 11/08/2019  A/P:We discussed with multiple gout flares will be reasonable to consider medication like allopurinol if uric acid above 6  # B12 deficiency-low normal B12 S: Current treatment/medication (oral vs. IM): 1000 mcg per day started last visit  Lab Results  Component Value Date   VITAMINB12 215 11/08/2019  A/P: low normal/potential deficiency last visit- update level today   #COPD-noted on lung cancer screening program.  Patient is asymptomatic.  Continue to monitor  # sometimes has foot pain with prolonged walking at work- given diclofenac by urgent care in 2020 and very helpful- only had to use one dose. I think this is ok as long as sparingly uses. Discussed also could try aleve/ibuprofen and may give similar effect  # insomnia- xanax helpful when needed.   Recommended follow up: 6 months physical   Lab/Order associations:   ICD-10-CM   1. Essential hypertension  I10 Comprehensive metabolic panel  2. Acute idiopathic gout of multiple sites  M10.09 Uric acid  3. Hyperlipidemia, unspecified hyperlipidemia type  E78.5   4. B12 deficiency  E53.8 Vitamin B12    Meds ordered this encounter  Medications  . diclofenac (VOLTAREN) 75 MG EC tablet    Sig: Take 1 tablet (75 mg total) by mouth 2 (two) times daily as needed (bilateral foot pain).    Dispense:  14 tablet    Refill:  0  . ALPRAZolam (XANAX) 0.5 MG tablet    Sig: Take 1 tablet (0.5 mg total) by mouth at bedtime as needed for anxiety.    Dispense:  30 tablet    Refill:  2   Return precautions advised.  01/09/2020, MD

## 2020-05-12 ENCOUNTER — Ambulatory Visit: Payer: 59 | Admitting: Family Medicine

## 2020-05-12 ENCOUNTER — Other Ambulatory Visit (HOSPITAL_COMMUNITY): Payer: Self-pay | Admitting: Family Medicine

## 2020-05-12 ENCOUNTER — Encounter: Payer: Self-pay | Admitting: Family Medicine

## 2020-05-12 ENCOUNTER — Other Ambulatory Visit: Payer: Self-pay

## 2020-05-12 VITALS — BP 130/80 | HR 67 | Temp 97.7°F | Ht 70.0 in | Wt 175.2 lb

## 2020-05-12 DIAGNOSIS — M1009 Idiopathic gout, multiple sites: Secondary | ICD-10-CM | POA: Diagnosis not present

## 2020-05-12 DIAGNOSIS — E538 Deficiency of other specified B group vitamins: Secondary | ICD-10-CM | POA: Insufficient documentation

## 2020-05-12 DIAGNOSIS — E785 Hyperlipidemia, unspecified: Secondary | ICD-10-CM | POA: Diagnosis not present

## 2020-05-12 DIAGNOSIS — I10 Essential (primary) hypertension: Secondary | ICD-10-CM

## 2020-05-12 DIAGNOSIS — K219 Gastro-esophageal reflux disease without esophagitis: Secondary | ICD-10-CM

## 2020-05-12 LAB — COMPREHENSIVE METABOLIC PANEL
ALT: 23 U/L (ref 0–53)
AST: 29 U/L (ref 0–37)
Albumin: 4 g/dL (ref 3.5–5.2)
Alkaline Phosphatase: 48 U/L (ref 39–117)
BUN: 13 mg/dL (ref 6–23)
CO2: 27 mEq/L (ref 19–32)
Calcium: 8.9 mg/dL (ref 8.4–10.5)
Chloride: 100 mEq/L (ref 96–112)
Creatinine, Ser: 0.81 mg/dL (ref 0.40–1.50)
GFR: 92.97 mL/min (ref >60.00)
Glucose, Bld: 94 mg/dL (ref 70–99)
Potassium: 4.1 mEq/L (ref 3.5–5.1)
Sodium: 134 mEq/L — ABNORMAL LOW (ref 135–145)
Total Bilirubin: 0.5 mg/dL (ref 0.2–1.2)
Total Protein: 6.9 g/dL (ref 6.0–8.3)

## 2020-05-12 LAB — VITAMIN B12: Vitamin B-12: 529 pg/mL (ref 211–911)

## 2020-05-12 LAB — URIC ACID: Uric Acid, Serum: 7.4 mg/dL (ref 4.0–7.8)

## 2020-05-12 MED ORDER — ALPRAZOLAM 0.5 MG PO TABS: 0.5000 mg | ORAL_TABLET | Freq: Every evening | ORAL | 2 refills | Status: DC | PRN

## 2020-05-12 MED ORDER — DICLOFENAC SODIUM 75 MG PO TBEC
75.0000 mg | DELAYED_RELEASE_TABLET | Freq: Two times a day (BID) | ORAL | 0 refills | Status: DC | PRN
Start: 2020-05-12 — End: 2020-12-19

## 2020-05-12 MED FILL — DICLOFENAC SOD EC 75 MG TAB: 75 | 7 days supply | Qty: 14 | Fill #0

## 2020-05-12 MED FILL — ALPRAZolam 0.5 MG TABS: 0.5 | 30 days supply | Qty: 30 | Fill #0

## 2020-05-13 ENCOUNTER — Other Ambulatory Visit: Payer: Self-pay

## 2020-05-13 ENCOUNTER — Other Ambulatory Visit (HOSPITAL_COMMUNITY): Payer: Self-pay | Admitting: Family Medicine

## 2020-05-13 ENCOUNTER — Other Ambulatory Visit: Payer: Self-pay | Admitting: Family Medicine

## 2020-05-13 MED ORDER — ALLOPURINOL 100 MG PO TABS: 100.0000 mg | ORAL_TABLET | Freq: Every day | ORAL | 3 refills | Status: DC

## 2020-05-13 MED FILL — ROSUVASTATIN CALCIUM 20 MG: 20 | 84 days supply | Qty: 24 | Fill #2

## 2020-05-14 MED FILL — NEBIVOLOL HCL 5 MG TABS: 5 | 90 days supply | Qty: 90 | Fill #0

## 2020-06-10 ENCOUNTER — Other Ambulatory Visit: Payer: Self-pay | Admitting: Family Medicine

## 2020-06-10 ENCOUNTER — Other Ambulatory Visit (HOSPITAL_COMMUNITY): Payer: Self-pay | Admitting: Family Medicine

## 2020-06-10 MED FILL — OMEPRAZOLE DR 20 MG CAPSULE: 20 | 90 days supply | Qty: 90 | Fill #0

## 2020-08-12 ENCOUNTER — Other Ambulatory Visit (HOSPITAL_COMMUNITY): Payer: Self-pay

## 2020-08-12 ENCOUNTER — Other Ambulatory Visit: Payer: Self-pay | Admitting: Family Medicine

## 2020-08-12 MED ORDER — ROSUVASTATIN CALCIUM 20 MG PO TABS
20.0000 mg | ORAL_TABLET | ORAL | 11 refills | Status: DC
Start: 2020-08-14 — End: 2021-05-20
  Filled 2020-08-12: qty 90, fill #0
  Filled 2020-08-12: qty 24, 84d supply, fill #0
  Filled 2020-11-06: qty 24, 84d supply, fill #1
  Filled 2021-02-03: qty 24, 84d supply, fill #2
  Filled 2021-05-07: qty 24, 84d supply, fill #3

## 2020-08-12 MED ORDER — NEBIVOLOL HCL 5 MG PO TABS
5.0000 mg | ORAL_TABLET | Freq: Every day | ORAL | 0 refills | Status: DC
  Filled 2020-08-12: qty 90, 90d supply, fill #0

## 2020-08-12 MED FILL — Alprazolam Tab 0.5 MG: ORAL | 30 days supply | Qty: 30 | Fill #0 | Status: AC

## 2020-08-15 ENCOUNTER — Other Ambulatory Visit (HOSPITAL_COMMUNITY): Payer: Self-pay

## 2020-09-03 ENCOUNTER — Other Ambulatory Visit (HOSPITAL_COMMUNITY): Payer: Self-pay

## 2020-09-03 ENCOUNTER — Other Ambulatory Visit: Payer: Self-pay | Admitting: Family Medicine

## 2020-09-03 MED ORDER — OMEPRAZOLE 20 MG PO CPDR
20.0000 mg | DELAYED_RELEASE_CAPSULE | Freq: Every day | ORAL | 0 refills | Status: DC
  Filled 2020-09-03: qty 90, 90d supply, fill #0

## 2020-10-20 ENCOUNTER — Other Ambulatory Visit (HOSPITAL_COMMUNITY): Payer: Self-pay

## 2020-10-20 MED FILL — Alprazolam Tab 0.5 MG: ORAL | 30 days supply | Qty: 30 | Fill #1 | Status: AC

## 2020-11-06 ENCOUNTER — Other Ambulatory Visit: Payer: Self-pay | Admitting: Family Medicine

## 2020-11-06 ENCOUNTER — Other Ambulatory Visit (HOSPITAL_COMMUNITY): Payer: Self-pay

## 2020-11-06 MED ORDER — NEBIVOLOL HCL 5 MG PO TABS
5.0000 mg | ORAL_TABLET | Freq: Every day | ORAL | 0 refills | Status: DC
  Filled 2020-11-06: qty 90, 90d supply, fill #0

## 2020-11-13 ENCOUNTER — Other Ambulatory Visit: Payer: Self-pay

## 2020-11-13 ENCOUNTER — Ambulatory Visit: Payer: 59 | Admitting: Family Medicine

## 2020-11-13 ENCOUNTER — Encounter: Payer: Self-pay | Admitting: Family Medicine

## 2020-11-13 VITALS — BP 127/82 | HR 65 | Temp 98.3°F | Ht 70.0 in | Wt 171.8 lb

## 2020-11-13 DIAGNOSIS — Z87891 Personal history of nicotine dependence: Secondary | ICD-10-CM | POA: Diagnosis not present

## 2020-11-13 DIAGNOSIS — E785 Hyperlipidemia, unspecified: Secondary | ICD-10-CM

## 2020-11-13 DIAGNOSIS — I10 Essential (primary) hypertension: Secondary | ICD-10-CM | POA: Diagnosis not present

## 2020-11-13 DIAGNOSIS — J439 Emphysema, unspecified: Secondary | ICD-10-CM | POA: Diagnosis not present

## 2020-11-13 DIAGNOSIS — Z125 Encounter for screening for malignant neoplasm of prostate: Secondary | ICD-10-CM | POA: Diagnosis not present

## 2020-11-13 DIAGNOSIS — M1009 Idiopathic gout, multiple sites: Secondary | ICD-10-CM | POA: Diagnosis not present

## 2020-11-13 DIAGNOSIS — Z0000 Encounter for general adult medical examination without abnormal findings: Secondary | ICD-10-CM

## 2020-11-13 DIAGNOSIS — D692 Other nonthrombocytopenic purpura: Secondary | ICD-10-CM | POA: Diagnosis not present

## 2020-11-13 DIAGNOSIS — E538 Deficiency of other specified B group vitamins: Secondary | ICD-10-CM

## 2020-11-13 DIAGNOSIS — I700 Atherosclerosis of aorta: Secondary | ICD-10-CM

## 2020-11-13 LAB — COMPREHENSIVE METABOLIC PANEL
ALT: 28 U/L (ref 0–53)
AST: 34 U/L (ref 0–37)
Albumin: 4.2 g/dL (ref 3.5–5.2)
Alkaline Phosphatase: 56 U/L (ref 39–117)
BUN: 10 mg/dL (ref 6–23)
CO2: 27 mEq/L (ref 19–32)
Calcium: 8.8 mg/dL (ref 8.4–10.5)
Chloride: 101 mEq/L (ref 96–112)
Creatinine, Ser: 0.78 mg/dL (ref 0.40–1.50)
GFR: 93.7 mL/min (ref >60.00)
Glucose, Bld: 89 mg/dL (ref 70–99)
Potassium: 3.6 mEq/L (ref 3.5–5.1)
Sodium: 138 mEq/L (ref 135–145)
Total Bilirubin: 0.7 mg/dL (ref 0.2–1.2)
Total Protein: 7.2 g/dL (ref 6.0–8.3)

## 2020-11-13 LAB — POC URINALSYSI DIPSTICK (AUTOMATED)
Bilirubin, UA: NEGATIVE
Blood, UA: NEGATIVE
Glucose, UA: NEGATIVE
Ketones, UA: NEGATIVE
Leukocytes, UA: NEGATIVE
Nitrite, UA: NEGATIVE
Protein, UA: NEGATIVE
Spec Grav, UA: 1.02 (ref 1.010–1.025)
Urobilinogen, UA: 0.2 E.U./dL
pH, UA: 6 (ref 5.0–8.0)

## 2020-11-13 LAB — CBC WITH DIFFERENTIAL/PLATELET
Basophils Absolute: 0.1 10*3/uL (ref 0.0–0.1)
Basophils Relative: 1.2 % (ref 0.0–3.0)
Eosinophils Absolute: 0.5 10*3/uL (ref 0.0–0.7)
Eosinophils Relative: 9.2 % — ABNORMAL HIGH (ref 0.0–5.0)
HCT: 39.8 % (ref 39.0–52.0)
Hemoglobin: 13.8 g/dL (ref 13.0–17.0)
Lymphocytes Relative: 37.3 % (ref 12.0–46.0)
Lymphs Abs: 2 10*3/uL (ref 0.7–4.0)
MCHC: 34.7 g/dL (ref 30.0–36.0)
MCV: 90.6 fl (ref 78.0–100.0)
Monocytes Absolute: 0.5 10*3/uL (ref 0.1–1.0)
Monocytes Relative: 9.1 % (ref 3.0–12.0)
Neutro Abs: 2.3 10*3/uL (ref 1.4–7.7)
Neutrophils Relative %: 43.2 % (ref 43.0–77.0)
Platelets: 197 10*3/uL (ref 150.0–400.0)
RBC: 4.39 Mil/uL (ref 4.22–5.81)
RDW: 12.6 % (ref 11.5–15.5)
WBC: 5.4 10*3/uL (ref 4.0–10.5)

## 2020-11-13 LAB — LIPID PANEL
Cholesterol: 197 mg/dL (ref 0–200)
HDL: 76.1 mg/dL (ref >39.00)
NonHDL: 121.38
Total CHOL/HDL Ratio: 3
Triglycerides: 390 mg/dL — ABNORMAL HIGH (ref 0.0–149.0)
VLDL: 78 mg/dL — ABNORMAL HIGH (ref 0.0–40.0)

## 2020-11-13 LAB — LDL CHOLESTEROL, DIRECT: Direct LDL: 70 mg/dL

## 2020-11-13 LAB — URIC ACID: Uric Acid, Serum: 7.6 mg/dL (ref 4.0–7.8)

## 2020-11-13 LAB — PSA: PSA: 0.79 ng/mL (ref 0.10–4.00)

## 2020-11-13 LAB — VITAMIN B12: Vitamin B-12: 999 pg/mL — ABNORMAL HIGH (ref 211–911)

## 2020-11-13 NOTE — Addendum Note (Signed)
Addended by: Jacob Moores on: 11/13/2020 08:42 AM   Modules accepted: Orders

## 2020-11-13 NOTE — Patient Instructions (Addendum)
Trial pepcid over-the-counter twice a day for a week. If symptoms continue to persist or worsen, please call and let us know.  Try to limit alcohol intake to 1-2 beverages per day, preferably one a day due to history of fatty acid liver.  Please stop by lab before you go If you have mychart- we will send your results within 3 business days of [place] receiving them.  If you do not have mychart- we will call you about results within 5 business days of [place] receiving them.  *please also note that you will see labs on mychart as soon as they post. I will later go in and write notes on them- will say "notes from Dr. Yong Channel"   Recommended follow up: Return in about 6 months (around 05/16/2021) for follow up- or sooner if needed.

## 2020-11-13 NOTE — Progress Notes (Signed)
Phone: [phone]   Subjective:  Patient presents today for their annual physical. Chief complaint-noted.   See problem oriented charting- ROS- full  review of systems was completed and negative  except for: runny nose, sneezing,  light sensitivity- knows he should start using sun glasses, food allergies  The following were reviewed and entered/updated in epic: Past Medical History:  Diagnosis Date   Allergy    seasonal allergies   Diverticulitis of colon 10/29/2009   After august 1995     Diverticulosis    Emphysema of lung ([place])    not on any meds-past smoker   GERD (gastroesophageal reflux disease)    on meds   Gout    Hypertension    Lipoma of neck    L neck-remains in place   Nasal congestion    Sebaceous cyst 01/07/2009   R axilla     Patient Active Problem List   Diagnosis Date Noted   B12 deficiency 05/12/2020    Priority: Medium   Emphysema of lung ([place]) 11/08/2019    Priority: Medium   Aortic atherosclerosis ([place]) 11/07/2018    Priority: Medium   Insomnia 08/23/2017    Priority: Medium   Gout 07/23/2014    Priority: Medium   Essential hypertension 07/23/2014    Priority: Medium   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    Priority: Medium   Former smoker 06/29/2007    Priority: Medium   Hyperlipidemia 02/15/2007    Priority: Medium   Fatty liver 11/08/2019    Priority: Low   Senile purpura ([place]) 11/08/2019    Priority: Low   Allergic rhinitis 07/23/2014    Priority: Low   Rosacea 07/23/2014    Priority: Low   Basal cell carcinoma of skin 01/07/2009    Priority: Low   GERD 02/15/2007    Priority: Low   Past Surgical History:  Procedure Laterality Date   COLONOSCOPY  2010   TICS/hems   none     WISDOM TOOTH EXTRACTION  1980    Family History  Problem Relation Age of Onset   Heart disease Mother        stopped psychiatric meds and BP meds, later with MI   Hypertension Mother    Schizophrenia Mother    Heart disease Father        sleep apnea not  controlled. 74.    Colon polyps Neg Hx    Colon cancer Neg Hx    Esophageal cancer Neg Hx    Rectal cancer Neg Hx    Stomach cancer Neg Hx     Medications- reviewed and updated Current Outpatient Medications  Medication Sig Dispense Refill   ALPRAZolam (XANAX) 0.5 MG tablet Take 1 tablet (0.5 mg total) by mouth at bedtime as needed for anxiety. 30 tablet 2   BIOTIN PO Take by mouth.     BLACK ELDERBERRY PO Take by mouth.     Cyanocobalamin (B-12 PO) Take by mouth.     KRILL OIL PO Take by mouth.     MILK THISTLE PO Take by mouth.     Multiple Vitamin (MULTIVITAMIN PO) Take by mouth.     Multiple Vitamins-Minerals (EYE VITAMINS PO) Take by mouth.     nebivolol (BYSTOLIC) 5 MG tablet Take 1 tablet (5 mg total) by mouth daily. 90 tablet 0   omeprazole (PRILOSEC) 20 MG capsule Take 1 capsule (20 mg total) by mouth daily. 90 capsule 0   rosuvastatin (CRESTOR) 20 MG tablet Take 1 tablet (20 mg total)  by mouth 2 (two) times a week. 8 tablet 11   saw palmetto 500 MG capsule Take 500 mg by mouth daily.     UNABLE TO FIND Urcinol-Uric acid Management     VITAMIN E PO Take by mouth.     colchicine 0.6 MG tablet TAKE 1 TABLET BY MOUTH EVERY 1 TO 2 HOURS UNTIL ONE OF THE FOLLOWING OCCURS.1.THE PAIN IS GONE.OR 2.THE MAXIMUM DOSE GIVEN IS NO MORE THAN 3 (Patient not taking: No sig reported) 10 tablet 5   diclofenac (VOLTAREN) 75 MG EC tablet Take 1 tablet (75 mg total) by mouth 2 (two) times daily as needed (bilateral foot pain). (Patient not taking: Reported on 11/13/2020) 14 tablet 0   No current facility-administered medications for this visit.    Allergies-reviewed and updated Allergies  Allergen Reactions   Aspirin Other (See Comments)    Abdominal cramping, gi discomfort   Milk-Related Compounds     GI Symptoms   Penicillins Palpitations    Makes heart race    Social History   Social History Narrative   Domestic partner, not sexually active [name] also a patient here). No  kids.       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    Objective  Objective:  BP 127/82 Comment: most recent home reading  Pulse 65   Temp 98.3 F (36.8 C) (Temporal)   Ht 5\' 10"  (1.778 m)   Wt 171 lb 12.8 oz (77.9 kg)   SpO2 99%   BMI 24.65 kg/m  Gen: NAD, resting comfortably HEENT: Mucous membranes are moist. Oropharynx normal Neck: no thyromegaly CV: RRR no murmurs rubs or gallops Lungs: CTAB no crackles, wheeze, rhonchi Abdomen: soft/nontender/nondistended/normal bowel sounds. No rebound or guarding.  Ext: no edema Skin: warm, dry Neuro: grossly normal, moves all extremities, PERRLA    Assessment and Plan  65 y.o. male presenting for annual physical.  Health Maintenance counseling: 1. Anticipatory guidance: Patient counseled regarding regular dental exams -q6 months, eye exams -yearly,  avoiding smoking and second hand smoke , limiting alcohol to 2 beverages per day -have actually advised 7 or less due to fatty liver- per day doing 3 or so.  2. Risk factor reduction:  Advised patient of need for regular exercise and diet rich and fruits and vegetables to reduce risk of heart attack and stroke. Exercise- 10 miles a day at work still- very active. Diet-  Congratulated patient on losing 4 pounds from last visit-closer to his baseline- enjoys fish- poultry bothers his gout Wt Readings from Last 3 Encounters:  11/13/20 171 lb 12.8 oz (77.9 kg)  05/12/20 175 lb 3.2 oz (79.5 kg)  01/29/20 168 lb (76.2 kg)  3. Immunizations/screenings/ancillary studies-discussed Shingrix- declines for now, discussed COVID-19 vaccination- wants to hold off on #3- multiple exposures and believe he had covid but has tested negative . Wants to hold off on prevnar 20.  Immunization History  Administered Date(s) Administered   Influenza,inj,Quad PF,6+ Mos 01/08/2013   Influenza-Unspecified 02/23/2015, 02/08/2020   PFIZER(Purple Top)SARS-COV-2 Vaccination  05/02/2019, 05/23/2019   Td 08/08/2005, 07/07/2016   Tdap 10/03/2015   4. Prostate cancer screening- -low risk prior PSA trend-we will continue to check with labs today . No nocturia on saw palmetto Lab Results  Component Value Date   PSA 0.78 11/08/2019   PSA 1.13 11/07/2018   PSA 0.94 11/04/2017   5. Colon cancer screening - history of tubular adenoma-01/29/2020- 7 year repeat  6. Skin cancer screening-follows with Dr. Ronnald Ramp of [facility] dermatology-he also manages his rosacea- has been 2-3 years but he is going to reschedule= avoiding chocolate helps. advised regular sunscreen use. Denies worrisome, changing, or new skin lesions.  7.  Former smoker-over 30 pack years.  Enrolled in lung cancer screening program- next due sept 2022.  Discussed AAA screening-we will wait until he is on Medicare  8. STD screening - not sexually active  Status of chronic or acute concerns   # social update- lost niece to drug overdose at age 21 in front of her son. She was saved per patient so that gives him some relief  #hypertension S: medication: Bystolic 5 mg Home readings #s: home readings excellent from 122- 136/ 99-24 with average certainly under 268/34 over last 8 readings. Most recent # added below BP Readings from Last 3 Encounters:  11/13/20 (!) 158/91. Home reading 127/82 most recently- will be added to chart  05/12/20 130/80  04/28/20 (!) 149/97  A/P:  Stable with excellent home control. Continue current medications.    #hyperlipidemia/aortic atherosclerosis S: Medication:rosuvastatin 20 mg twice weekly  Lab Results  Component Value Date   CHOL 166 11/08/2019   HDL 78.00 11/08/2019   LDLCALC 115 (H) 11/04/2017   LDLDIRECT 61.0 11/08/2019   TRIG 211.0 (H) 11/08/2019   CHOLHDL 2 11/08/2019   A/P: LDL with excellent control last year-due for full lipid panel repeat. Continue current meds for now. LDL goal under 70 due to plaque on aorta.    #Gout S: 0 flares in last 6 months on urcinol  since last visit Medications: Colchicine 0.6 mg in the past- - not helping anymore- celery has been very helpfulif flare - urcinol has helped prevent flares Lab Results  Component Value Date   LABURIC 7.4 05/12/2020   A/P:Stable. Continue current medications.  Check uric acid today but unlikely to change medicine since not having flares  # B12 deficiency S: Current treatment/medication (oral vs. IM):  1000 mcg per day Lab Results  Component Value Date   [id number] 921 05/12/2020  A/P: hopefully stable- update b12 today. Continue current meds for now    # GERD S:Medication: Omeprazole 20 mg each AM A/P: agrees to trial pepcid otc with good control.  Failed zantac in past  #COPD-noted on lung cancer screening program.  Asymptomatic - continue to monitor as stable without meds  #foot pain with prolonged walking- was given diclofenac by urgent care in 2020 and found this to be very helpful- only had to use one dose. I discussed how this was okay if it was used sparingly. We also discussed about alternatives aleve/ibuprofen which may give a similar effect. States not having to use this much lately- using [employer] shoes  #insomnia- xanax is helpful when needed- half tablet before bed at times. Anywhere from 1-4x a week. Discussed avoiding   #Senile purpura-stable- update cbc.    #reports some curvature with erections- usually at night- no pain thankfully and not sexually active. Not worse over last year  Recommended follow up: Return in about 6 months (around 05/16/2021) for follow up- or sooner if needed.  Lab/Order associations: fasting   ICD-10-CM   1. Preventative health care  Z00.00 CBC with Differential/Platelet    Comprehensive metabolic panel    Lipid panel    PSA    B12    Uric acid    2. Essential hypertension  I10     3. Hyperlipidemia, unspecified hyperlipidemia type  E78.5 CBC  with Differential/Platelet    Comprehensive metabolic panel    Lipid panel    4. B12  deficiency  E53.8 B12    5. Screening for prostate cancer  Z12.5 PSA    6. Pulmonary emphysema, unspecified emphysema type (HCC) Chronic J43.9     7. Aortic atherosclerosis (HCC) Chronic I70.0     8. Senile purpura (HCC) Chronic D69.2     9. Acute idiopathic gout of multiple sites  M10.09 Uric acid    10. Former smoker  Z87.891 POCT Urinalysis Dipstick (Automated)      No orders of the defined types were placed in this encounter.  I,Harris Phan,acting as a Education administrator for Garret Reddish, MD.,have documented all relevant documentation on the behalf of Garret Reddish, MD,as directed by  Garret Reddish, MD while in the presence of Garret Reddish, MD.  I, Garret Reddish, MD, have reviewed all documentation for this visit. The documentation on 11/13/20 for the exam, diagnosis, procedures, and orders are all accurate and complete.   Return precautions advised.  Garret Reddish, MD

## 2020-11-27 ENCOUNTER — Other Ambulatory Visit: Payer: Self-pay

## 2020-11-27 ENCOUNTER — Encounter (HOSPITAL_COMMUNITY): Payer: Self-pay

## 2020-11-27 ENCOUNTER — Ambulatory Visit (HOSPITAL_COMMUNITY)
Admission: EM | Admit: 2020-11-27 | Discharge: 2020-11-27 | Disposition: A | Discharge status: Home / Self Care | Payer: 59 | Attending: Student | Admitting: Student

## 2020-11-27 ENCOUNTER — Other Ambulatory Visit (HOSPITAL_COMMUNITY): Payer: Self-pay

## 2020-11-27 DIAGNOSIS — M10071 Idiopathic gout, right ankle and foot: Secondary | ICD-10-CM | POA: Diagnosis not present

## 2020-11-27 MED ORDER — PREDNISONE 10 MG PO TABS
ORAL_TABLET | Freq: Every day | ORAL | 0 refills | Status: DC
  Filled 2020-11-27: qty 42, 12d supply, fill #0

## 2020-11-27 NOTE — ED Provider Notes (Signed)
[facility]    CSN: [id number] Arrival date & time: 11/27/20  0800      History   Chief Complaint Chief Complaint  Patient presents with   Foot Pain    HPI Angel Ellis is a 65 y.o. male presenting with his typical gout symptoms since waking up- precipitated by eating chicken nuggets.  Medical history gout, GERD, hypertension, diverticulitis. R great toe pain, worse with walking and ambulating. Denies new trauma. Took a colchicine without improvement. Has had success with prednisone taper in the past.  HPI  Past Medical History:  Diagnosis Date   Allergy    seasonal allergies   Diverticulitis of colon 10/29/2009   After august 1995     Diverticulosis    Emphysema of lung ([place])    not on any meds-past smoker   GERD (gastroesophageal reflux disease)    on meds   Gout    Hypertension    Lipoma of neck    L neck-remains in place   Nasal congestion    Sebaceous cyst 01/07/2009   R axilla      Patient Active Problem List   Diagnosis Date Noted   B12 deficiency 05/12/2020   Emphysema of lung ([place]) 11/08/2019   Fatty liver 11/08/2019   Senile purpura ([place]) 11/08/2019   Aortic atherosclerosis ([place]) 11/07/2018   Insomnia 08/23/2017   Gout 07/23/2014   Allergic rhinitis 07/23/2014   Essential hypertension 07/23/2014   Rosacea 07/23/2014   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 Authorizing Provider  predniSONE (STERAPRED UNI-PAK 21 TAB) 10 MG (21) TBPK tablet Take by mouth daily. Take 6 tabs by mouth daily  for 2 days, then 5 tabs for 2 days, then 4 tabs for 2 days, then 3 tabs for 2 days, 2 tabs for 2 days, then 1 tab by  mouth daily for 2 days 11/27/20  Yes Phillip Heal, Sherlon Handing, PA-C  ALPRAZolam Duanne Moron) 0.5 MG tablet Take 1 tablet (0.5 mg total) by mouth at bedtime as needed for anxiety. 05/12/20   Marin Olp, MD  BIOTIN PO Take by mouth.    [provider]  BLACK ELDERBERRY PO Take by mouth.    [provider]  colchicine 0.6 MG tablet TAKE 1 TABLET BY MOUTH EVERY 1 TO 2 HOURS UNTIL ONE OF THE FOLLOWING OCCURS.1.THE PAIN IS GONE.OR 2.THE MAXIMUM DOSE GIVEN IS NO MORE THAN 3 Patient not taking: No sig reported 01/22/20   Marin Olp, MD  Cyanocobalamin (B-12 PO) Take by mouth.    [provider]  diclofenac (VOLTAREN) 75 MG EC tablet Take 1 tablet (75 mg total) by mouth 2 (two) times daily as needed (bilateral foot pain). Patient not taking: Reported on 11/13/2020 05/12/20   Marin Olp, MD  KRILL OIL PO Take by mouth.    [provider]  MILK THISTLE PO Take by mouth.    [provider]  Multiple Vitamin (MULTIVITAMIN PO) Take by mouth.    [provider]  Multiple Vitamins-Minerals (EYE VITAMINS PO) Take by mouth.    [provider]  nebivolol (BYSTOLIC) 5 MG tablet Take 1 tablet (5 mg total) by mouth daily. 11/06/20   Marin Olp, MD  omeprazole (PRILOSEC) 20 MG capsule Take 1 capsule (20 mg total) by mouth daily. 09/03/20   Marin Olp, MD  rosuvastatin (CRESTOR) 20 MG tablet Take 1 tablet (20 mg total) by mouth 2 (two) times a week. 08/14/20   Marin Olp, MD  saw palmetto 500 MG capsule Take 500 mg by mouth daily.    [provider]  UNABLE TO FIND Urcinol-Uric acid Management    [provider]  VITAMIN E PO Take by mouth.    [provider]  fluticasone (FLONASE) 50 MCG/ACT nasal spray Place 2 sprays into both nostrils daily. Patient not taking: Reported on 12/28/2019 11/07/18 03/12/20  Marin Olp, MD    Family History Family History  Problem Relation Age of Onset   Heart disease Mother         stopped psychiatric meds and BP meds, later with MI   Hypertension Mother    Schizophrenia Mother    Heart disease Father        sleep apnea not controlled. 74.    Colon polyps Neg Hx    Colon cancer Neg Hx    Esophageal cancer Neg Hx    Rectal cancer Neg Hx    Stomach cancer Neg Hx     Social History Social History   Tobacco Use   Smoking status: Former    Packs/day: 1.00    Years: 37.00    Pack years: 37.00    Types: Cigarettes    Quit date: 2011    Years since quitting: 11.5   Smokeless tobacco: Never  Vaping Use   Vaping Use: Never used  Substance Use Topics   Alcohol use: Yes    Alcohol/week: 21.0 standard drinks    Types: 21 Standard drinks or equivalent per week    Comment: one per day   Drug use: No     Allergies   Aspirin, Milk-related compounds, and Penicillins   Review of Systems Review of Systems  Musculoskeletal:        R great toe pain  All other systems reviewed and are negative.   Physical Exam Triage Vital Signs ED Triage Vitals  Enc Vitals Group     BP 11/27/20 0832 136/84     Pulse Rate 11/27/20 0832 72     Resp 11/27/20 0832 18     Temp 11/27/20 0832 98.6 F (37 C)     Temp Source 11/27/20 0832 Oral     SpO2 11/27/20 0832 100 %     Weight --      Height --      Head Circumference --      Peak Flow --      Pain Score 11/27/20 0830 8     Pain Loc --      Pain Edu? --      Excl. in [place]? --    No data found.  Updated Vital Signs BP 136/84 (BP Location: Right Arm)   Pulse 72   Temp 98.6 F (37 C) (Oral)   Resp 18   SpO2 100%   Visual Acuity Right Eye Distance:   Left Eye Distance:   Bilateral Distance:    Right Eye Near:   Left Eye Near:    Bilateral Near:     Physical Exam Vitals reviewed.  Constitutional:      General:  He is not in acute distress.    Appearance: Normal appearance. He is not ill-appearing or diaphoretic.  HENT:     Head: Normocephalic and atraumatic.  Cardiovascular:     Rate and  Rhythm: Normal rate and regular rhythm.     Heart sounds: Normal heart sounds.  Pulmonary:     Effort: Pulmonary effort is normal.     Breath sounds: Normal breath sounds.  Musculoskeletal:     Comments: R great toe with tenderness warmth and effusion, worst over MTP joint. DP 2+, cap refill <2 seconds, no midfoot tenderness. No ankle/malleolar tenderness or effusion.  Skin:    General: Skin is warm.  Neurological:     General: No focal deficit present.     Mental Status: He is alert and oriented to person, place, and time.  Psychiatric:        Mood and Affect: Mood normal.        Behavior: Behavior normal.        Thought Content: Thought content normal.        Judgment: Judgment normal.     UC Treatments / Results  Labs (all labs ordered are listed, but only abnormal results are displayed) Labs Reviewed - No data to display  EKG   Radiology No results found.  Procedures Procedures (including critical care time)  Medications Ordered in UC Medications - No data to display  Initial Impression / Assessment and Plan / UC Course  I have reviewed the triage vital signs and the nursing notes.  Pertinent labs & imaging results that were available during my care of the patient were reviewed by me and considered in my medical decision making (see chart for details).     This patient is a very pleasant 65 y.o. year old male presenting with gout R great toe, following eating chicken nuggets. Afebrile, nontahchy.  Colchicine did not provide relief. Prednisone taper as below.  ED return precautions discussed. Patient verbalizes understanding and agreement.   Final Clinical Impressions(s) / UC Diagnoses   Final diagnoses:  Acute idiopathic gout involving toe of right foot     Discharge Instructions      -Prednisone taper for gout. I recommend taking this in the morning as it could give you energy.  Avoid NSAIDs like ibuprofen and alleve while taking this medication as  they can increase your risk of stomach upset and even GI bleeding when in combination with a steroid. You can continue tylenol (acetaminophen) up to 1000mg  3x daily. -Tylenol for additional relief      ED Prescriptions     Medication Sig Dispense Auth. Provider   predniSONE (STERAPRED UNI-PAK 21 TAB) 10 MG (21) TBPK tablet Take by mouth daily. Take 6 tabs by mouth daily  for 2 days, then 5 tabs for 2 days, then 4 tabs for 2 days, then 3 tabs for 2 days, 2 tabs for 2 days, then 1 tab by mouth daily for 2 days 42 tablet Hazel Sams, PA-C      PDMP not reviewed this encounter.   Hazel Sams, PA-C 11/27/20 [phone]

## 2020-11-27 NOTE — ED Triage Notes (Signed)
Pt reports pain in the right foot related to gout since this morning, Reports he took colchicine and did not give relief.

## 2020-11-27 NOTE — Discharge Instructions (Addendum)
-  Prednisone taper for gout. I recommend taking this in the morning as it could give you energy.  Avoid NSAIDs like ibuprofen and alleve while taking this medication as they can increase your risk of stomach upset and even GI bleeding when in combination with a steroid. You can continue tylenol (acetaminophen) up to 1000mg  3x daily. -Tylenol for additional relief

## 2020-12-17 ENCOUNTER — Encounter: Payer: Self-pay | Admitting: Family Medicine

## 2020-12-18 ENCOUNTER — Encounter: Payer: Self-pay | Admitting: Family Medicine

## 2020-12-19 ENCOUNTER — Other Ambulatory Visit: Payer: Self-pay

## 2020-12-19 ENCOUNTER — Encounter (HOSPITAL_COMMUNITY): Payer: Self-pay | Admitting: Emergency Medicine

## 2020-12-19 ENCOUNTER — Other Ambulatory Visit (HOSPITAL_COMMUNITY): Payer: Self-pay

## 2020-12-19 ENCOUNTER — Ambulatory Visit (HOSPITAL_COMMUNITY)
Admission: EM | Admit: 2020-12-19 | Discharge: 2020-12-19 | Disposition: A | Discharge status: Home / Self Care | Payer: 59 | Attending: Physician Assistant | Admitting: Physician Assistant

## 2020-12-19 DIAGNOSIS — R195 Other fecal abnormalities: Secondary | ICD-10-CM

## 2020-12-19 DIAGNOSIS — M109 Gout, unspecified: Secondary | ICD-10-CM | POA: Diagnosis not present

## 2020-12-19 DIAGNOSIS — M79671 Pain in right foot: Secondary | ICD-10-CM

## 2020-12-19 MED ORDER — PREDNISONE 10 MG PO TABS
40.0000 mg | ORAL_TABLET | Freq: Every day | ORAL | 0 refills | Status: AC
  Filled 2020-12-19: qty 20, 5d supply, fill #0

## 2020-12-19 NOTE — Discharge Instructions (Addendum)
We are going to start prednisone but at a lower dose.  Please take 4 tablets (40 mg) in the morning.  You should not take any NSAIDs including aspirin, ibuprofen/Advil, naproxen/Aleve with this medication as it can cause stomach bleeding.  Please monitor for any abdominal upset including black or dark stools.  Follow-up with your primary care provider as scheduled.  Keep your foot elevated and use ice, compression, rest for symptom relief.  If you have any worsening symptoms including medication side effects you need to be seen immediately.

## 2020-12-19 NOTE — ED Provider Notes (Signed)
[place]    CSN: [id number] Arrival date & time: 12/19/20  1055      History   Chief Complaint Chief Complaint  Patient presents with   Foot Pain    Right     HPI Angel Ellis is a 65 y.o. male.   Patient presents today with a 4-day history of right foot pain.  He has a history of gout and was recently treated on 11/27/2020 with prednisone taper.  He reports that symptoms resolved quickly after starting prednisone but he then developed side effects including headache as well as concern for GI bleed as he had abdominal upset with metallic belching and one episode of dark stool.  Reports that these have since resolved and patient is scheduled to follow-up with primary care provider early next week and was encouraged to keep this appointment to have lab work obtained.  Denies any chest pain, shortness of breath, heart racing, lightheadedness.  He has previously taken colchicine but states this is now ineffective.  Reports pain is rated 5 on a 0-10 pain scale, localized to right foot, described as aching, no alleviating factors identified.  Uric acid level was 7.6 on 11/13/2020.  Patient is not currently prescribed allopurinol but intends to discuss this with his primary care provider at follow-up appointment.  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   Past Medical History:  Diagnosis Date   Allergy    seasonal allergies   Diverticulitis of colon 10/29/2009   After august 1995     Diverticulosis    Emphysema of lung ([place])    not on any meds-past smoker   GERD (gastroesophageal reflux disease)    on meds   Gout    Hypertension    Lipoma of neck    L neck-remains in place   Nasal congestion    Sebaceous cyst 01/07/2009   R axilla      Patient Active Problem List   Diagnosis Date Noted   B12 deficiency 05/12/2020    Emphysema of lung ([place]) 11/08/2019   Fatty liver 11/08/2019   Senile purpura ([place]) 11/08/2019   Aortic atherosclerosis ([place]) 11/07/2018   Insomnia 08/23/2017   Gout 07/23/2014   Allergic rhinitis 07/23/2014   Essential hypertension 07/23/2014   Rosacea 07/23/2014   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 Authorizing Provider  predniSONE (DELTASONE) 10 MG tablet Take 4 tablets (40 mg total) by mouth daily for 5 days. 12/19/20 12/24/20 Yes Sylvia Helms, Derry Skill, PA-C  ALPRAZolam (XANAX) 0.5 MG tablet Take 1 tablet (0.5 mg total) by mouth at bedtime as needed for anxiety. 05/12/20   Marin Olp, MD  BIOTIN PO Take by mouth.    [provider]  BLACK ELDERBERRY PO Take by mouth.    [provider]  colchicine 0.6 MG tablet TAKE 1 TABLET BY MOUTH EVERY 1 TO 2 HOURS UNTIL ONE OF THE FOLLOWING OCCURS.1.THE PAIN IS  GONE.OR 2.THE MAXIMUM DOSE GIVEN IS NO MORE THAN 3 Patient not taking: No sig reported 01/22/20   Marin Olp, MD  Cyanocobalamin (B-12 PO) Take by mouth.    [provider]  KRILL OIL PO Take by mouth.    [provider]  MILK THISTLE PO Take by mouth.    [provider]  Multiple Vitamin (MULTIVITAMIN PO) Take by mouth.    [provider]  Multiple Vitamins-Minerals (EYE VITAMINS PO) Take by mouth.    [provider]  nebivolol (BYSTOLIC) 5 MG tablet Take 1 tablet (5 mg total) by mouth daily. 11/06/20   Marin Olp, MD  omeprazole (PRILOSEC) 20 MG capsule Take 1 capsule (20 mg total) by mouth daily. 09/03/20   Marin Olp, MD  rosuvastatin (CRESTOR) 20 MG tablet Take 1  tablet (20 mg total) by mouth 2 (two) times a week. 08/14/20   Marin Olp, MD  saw palmetto 500 MG capsule Take 500 mg by mouth daily.    [provider]  UNABLE TO FIND Urcinol-Uric acid Management    [provider]  VITAMIN E PO Take by mouth.    [provider]  fluticasone (FLONASE) 50 MCG/ACT nasal spray Place 2 sprays into both nostrils daily. Patient not taking: Reported on 12/28/2019 11/07/18 03/12/20  Marin Olp, MD    Family History Family History  Problem Relation Age of Onset   Heart disease Mother        stopped psychiatric meds and BP meds, later with MI   Hypertension Mother    Schizophrenia Mother    Heart disease Father        sleep apnea not controlled. 74.    Colon polyps Neg Hx    Colon cancer Neg Hx    Esophageal cancer Neg Hx    Rectal cancer Neg Hx    Stomach cancer Neg Hx     Social History Social History   Tobacco Use   Smoking status: Former    Packs/day: 1.00    Years: 37.00    Pack years: 37.00    Types: Cigarettes    Quit date: 2011    Years since quitting: 11.6   Smokeless tobacco: Never  Vaping Use   Vaping Use: Never used  Substance Use Topics   Alcohol use: Yes    Alcohol/week: 21.0 standard drinks    Types: 21 Standard drinks or equivalent per week    Comment: one per day   Drug use: No     Allergies   Aspirin, Milk-related compounds, and Penicillins   Review of Systems Review of Systems  Constitutional:  Positive for activity change. Negative for appetite change, fatigue and fever.  Respiratory:  Negative for cough and shortness of breath.   Cardiovascular:  Negative for chest pain.  Gastrointestinal:  Negative for abdominal pain, diarrhea, nausea and vomiting.  Musculoskeletal:  Positive for arthralgias, gait problem and joint swelling. Negative for myalgias.  Skin:  Positive for color change. Negative for wound.  Neurological:  Negative for dizziness, weakness, light-headedness,  numbness and headaches.    Physical Exam Triage Vital Signs ED Triage Vitals  Enc Vitals Group     BP 12/19/20 1229 114/79     Pulse Rate 12/19/20 1229 81     Resp 12/19/20 1229 16     Temp 12/19/20 1229 98.5 F (36.9 C)     Temp Source 12/19/20 1229 Oral     SpO2 12/19/20 1229 100 %  Weight --      Height --      Head Circumference --      Peak Flow --      Pain Score 12/19/20 1228 4     Pain Loc --      Pain Edu? --      Excl. in [place]? --    No data found.  Updated Vital Signs BP 114/79 (BP Location: Right Arm)   Pulse 81   Temp 98.5 F (36.9 C) (Oral)   Resp 16   SpO2 100%   Visual Acuity Right Eye Distance:   Left Eye Distance:   Bilateral Distance:    Right Eye Near:   Left Eye Near:    Bilateral Near:     Physical Exam Vitals reviewed.  Constitutional:      General: He is awake.     Appearance: Normal appearance. He is normal weight. He is not ill-appearing.     Comments: Very pleasant male appears stated age no acute distress sitting comfortably in exam room  HENT:     Head: Normocephalic and atraumatic.  Cardiovascular:     Rate and Rhythm: Normal rate and regular rhythm.     Pulses:          Posterior tibial pulses are 2+ on the right side.     Heart sounds: Normal heart sounds, S1 normal and S2 normal. No murmur heard. Pulmonary:     Effort: Pulmonary effort is normal.     Breath sounds: Normal breath sounds. No stridor. No wheezing, rhonchi or rales.     Comments: Clear to auscultation bilaterally Abdominal:     Palpations: Abdomen is soft.     Tenderness: There is no abdominal tenderness.  Musculoskeletal:     Right foot: Normal range of motion. No deformity.  Feet:     Right foot:     Protective Sensation: 10 sites tested.  10 sites sensed.     Skin integrity: Warmth present. No ulcer, blister or skin breakdown.     Comments: Right foot: Tender palpation over dorsal right foot.  No deformity noted.  Mild swelling and warmth to  palpation.  Foot neurovascularly intact. Neurological:     Mental Status: He is alert.  Psychiatric:        Behavior: Behavior is cooperative.     UC Treatments / Results  Labs (all labs ordered are listed, but only abnormal results are displayed) Labs Reviewed - No data to display  EKG   Radiology No results found.  Procedures Procedures (including critical care time)  Medications Ordered in UC Medications - No data to display  Initial Impression / Assessment and Plan / UC Course  I have reviewed the triage vital signs and the nursing notes.  Pertinent labs & imaging results that were available during my care of the patient were reviewed by me and considered in my medical decision making (see chart for details).      Patient is unable to take NSAIDs due to allergy.  He reports that colchicine is no longer effective for gout.  He has some concerns about restarting prednisone but is open to starting a lower dose.  Will prescribe 40 mg for up to 5 days but he was encouraged to discontinue medication 24 hours after symptoms improved.  He denies any current signs of bleeding including black or bloody stools was encouraged to follow-up with primary care provider and monitor for any concerning symptoms.  We discussed that we  typically do not start allopurinol during an acute flare but given recurrent episodes it would be worthwhile to discuss this with his primary care provider.  He was instructed not to take NSAIDs (allergic anyway) with prednisone but can take Tylenol as needed.  He was given work excuse note.  Recommended he use RICE protocol for additional symptom relief.  Strict return precautions given to which patient expressed understanding.  Final Clinical Impressions(s) / UC Diagnoses   Final diagnoses:  Acute gout of right foot, unspecified cause  Foot pain, right     Discharge Instructions      We are going to start prednisone but at a lower dose.  Please take 4  tablets (40 mg) in the morning.  You should not take any NSAIDs including aspirin, ibuprofen/Advil, naproxen/Aleve with this medication as it can cause stomach bleeding.  Please monitor for any abdominal upset including black or dark stools.  Follow-up with your primary care provider as scheduled.  Keep your foot elevated and use ice, compression, rest for symptom relief.  If you have any worsening symptoms including medication side effects you need to be seen immediately.     ED Prescriptions     Medication Sig Dispense Auth. Provider   predniSONE (DELTASONE) 10 MG tablet Take 4 tablets (40 mg total) by mouth daily for 5 days. 20 tablet Redith Drach, Derry Skill, PA-C      PDMP not reviewed this encounter.   Terrilee Croak, PA-C 12/19/20 1316

## 2020-12-19 NOTE — ED Triage Notes (Signed)
Pt presents with right foot pain due to Gout xs 4 days. States was treated for gout on 11/27/20.

## 2020-12-22 ENCOUNTER — Other Ambulatory Visit (HOSPITAL_COMMUNITY): Payer: Self-pay

## 2020-12-22 ENCOUNTER — Other Ambulatory Visit: Payer: Self-pay

## 2020-12-22 ENCOUNTER — Other Ambulatory Visit (INDEPENDENT_AMBULATORY_CARE_PROVIDER_SITE_OTHER): Payer: 59

## 2020-12-22 DIAGNOSIS — R195 Other fecal abnormalities: Secondary | ICD-10-CM

## 2020-12-22 MED ORDER — ALPRAZOLAM 0.5 MG PO TABS
0.5000 mg | ORAL_TABLET | Freq: Every evening | ORAL | 2 refills | Status: DC | PRN
  Filled 2020-12-22: qty 30, 30d supply, fill #0
  Filled 2021-05-07: qty 30, 30d supply, fill #1

## 2020-12-22 NOTE — Telephone Encounter (Signed)
..   Encourage patient to contact the pharmacy for refills or they can request refills through [place]:  11/13/20  NEXT APPOINTMENT DATE: 05/20/21  MEDICATION: alprazolam 0.5  Is the patient out of medication?   PHARMACY:  [facility]  Let patient know to contact pharmacy at the end of the day to make sure medication is ready.  Please notify patient to allow 48-72 hours to process  CLINICAL FILLS OUT ALL BELOW:   LAST REFILL:  QTY:  REFILL DATE:    OTHER COMMENTS:   Patient was trying melatonin   Okay for refill?  Please advise

## 2020-12-22 NOTE — Telephone Encounter (Signed)
Rx has been sent to Dr. Yong Channel for approval.

## 2020-12-25 ENCOUNTER — Other Ambulatory Visit: Payer: Self-pay

## 2020-12-25 ENCOUNTER — Other Ambulatory Visit (INDEPENDENT_AMBULATORY_CARE_PROVIDER_SITE_OTHER): Payer: 59

## 2020-12-25 DIAGNOSIS — Z0000 Encounter for general adult medical examination without abnormal findings: Secondary | ICD-10-CM | POA: Diagnosis not present

## 2020-12-25 DIAGNOSIS — E785 Hyperlipidemia, unspecified: Secondary | ICD-10-CM | POA: Diagnosis not present

## 2020-12-25 LAB — CBC WITH DIFFERENTIAL/PLATELET
Basophils Absolute: 0 10*3/uL (ref 0.0–0.1)
Basophils Relative: 1.1 % (ref 0.0–3.0)
Eosinophils Absolute: 0.4 10*3/uL (ref 0.0–0.7)
Eosinophils Relative: 9.7 % — ABNORMAL HIGH (ref 0.0–5.0)
HCT: 34.5 % — ABNORMAL LOW (ref 39.0–52.0)
Hemoglobin: 11.7 g/dL — ABNORMAL LOW (ref 13.0–17.0)
Lymphocytes Relative: 40 % (ref 12.0–46.0)
Lymphs Abs: 1.5 10*3/uL (ref 0.7–4.0)
MCHC: 33.9 g/dL (ref 30.0–36.0)
MCV: 92.8 fl (ref 78.0–100.0)
Monocytes Absolute: 0.4 10*3/uL (ref 0.1–1.0)
Monocytes Relative: 11.2 % (ref 3.0–12.0)
Neutro Abs: 1.4 10*3/uL (ref 1.4–7.7)
Neutrophils Relative %: 38 % — ABNORMAL LOW (ref 43.0–77.0)
Platelets: 299 10*3/uL (ref 150.0–400.0)
RBC: 3.71 Mil/uL — ABNORMAL LOW (ref 4.22–5.81)
RDW: 13 % (ref 11.5–15.5)
WBC: 3.7 10*3/uL — ABNORMAL LOW (ref 4.0–10.5)

## 2020-12-26 ENCOUNTER — Other Ambulatory Visit: Payer: Self-pay

## 2020-12-26 DIAGNOSIS — D649 Anemia, unspecified: Secondary | ICD-10-CM

## 2020-12-29 ENCOUNTER — Other Ambulatory Visit (INDEPENDENT_AMBULATORY_CARE_PROVIDER_SITE_OTHER): Payer: 59

## 2020-12-29 DIAGNOSIS — D649 Anemia, unspecified: Secondary | ICD-10-CM

## 2020-12-29 LAB — CBC WITH DIFFERENTIAL/PLATELET
Basophils Absolute: 0 10*3/uL (ref 0.0–0.1)
Basophils Relative: 0.6 % (ref 0.0–3.0)
Eosinophils Absolute: 0.4 10*3/uL (ref 0.0–0.7)
Eosinophils Relative: 6.4 % — ABNORMAL HIGH (ref 0.0–5.0)
HCT: 34.9 % — ABNORMAL LOW (ref 39.0–52.0)
Hemoglobin: 11.6 g/dL — ABNORMAL LOW (ref 13.0–17.0)
Lymphocytes Relative: 34.5 % (ref 12.0–46.0)
Lymphs Abs: 2.1 10*3/uL (ref 0.7–4.0)
MCHC: 33.3 g/dL (ref 30.0–36.0)
MCV: 93.9 fl (ref 78.0–100.0)
Monocytes Absolute: 0.6 10*3/uL (ref 0.1–1.0)
Monocytes Relative: 9.8 % (ref 3.0–12.0)
Neutro Abs: 2.9 10*3/uL (ref 1.4–7.7)
Neutrophils Relative %: 48.7 % (ref 43.0–77.0)
Platelets: 301 10*3/uL (ref 150.0–400.0)
RBC: 3.71 Mil/uL — ABNORMAL LOW (ref 4.22–5.81)
RDW: 12.8 % (ref 11.5–15.5)
WBC: 6 10*3/uL (ref 4.0–10.5)

## 2020-12-30 ENCOUNTER — Other Ambulatory Visit: Payer: Self-pay

## 2020-12-30 DIAGNOSIS — D649 Anemia, unspecified: Secondary | ICD-10-CM

## 2021-01-02 ENCOUNTER — Encounter: Payer: Self-pay | Admitting: Family Medicine

## 2021-01-05 ENCOUNTER — Other Ambulatory Visit: Payer: Self-pay

## 2021-01-05 ENCOUNTER — Other Ambulatory Visit (INDEPENDENT_AMBULATORY_CARE_PROVIDER_SITE_OTHER): Payer: 59

## 2021-01-05 DIAGNOSIS — D649 Anemia, unspecified: Secondary | ICD-10-CM | POA: Diagnosis not present

## 2021-01-05 LAB — CBC WITH DIFFERENTIAL/PLATELET
Basophils Absolute: 0.1 10*3/uL (ref 0.0–0.1)
Basophils Relative: 1.1 % (ref 0.0–3.0)
Eosinophils Absolute: 0.4 10*3/uL (ref 0.0–0.7)
Eosinophils Relative: 6 % — ABNORMAL HIGH (ref 0.0–5.0)
HCT: 36.2 % — ABNORMAL LOW (ref 39.0–52.0)
Hemoglobin: 12.2 g/dL — ABNORMAL LOW (ref 13.0–17.0)
Lymphocytes Relative: 31.8 % (ref 12.0–46.0)
Lymphs Abs: 2.1 10*3/uL (ref 0.7–4.0)
MCHC: 33.8 g/dL (ref 30.0–36.0)
MCV: 93.7 fl (ref 78.0–100.0)
Monocytes Absolute: 0.6 10*3/uL (ref 0.1–1.0)
Monocytes Relative: 8.3 % (ref 3.0–12.0)
Neutro Abs: 3.5 10*3/uL (ref 1.4–7.7)
Neutrophils Relative %: 52.8 % (ref 43.0–77.0)
Platelets: 229 10*3/uL (ref 150.0–400.0)
RBC: 3.87 Mil/uL — ABNORMAL LOW (ref 4.22–5.81)
RDW: 13 % (ref 11.5–15.5)
WBC: 6.7 10*3/uL (ref 4.0–10.5)

## 2021-01-13 ENCOUNTER — Other Ambulatory Visit: Payer: 59

## 2021-01-15 LAB — FECAL OCCULT BLOOD, IMMUNOCHEMICAL: Fecal Occult Bld: POSITIVE — AB

## 2021-01-19 ENCOUNTER — Other Ambulatory Visit: Payer: Self-pay

## 2021-01-19 DIAGNOSIS — R195 Other fecal abnormalities: Secondary | ICD-10-CM

## 2021-01-19 DIAGNOSIS — D649 Anemia, unspecified: Secondary | ICD-10-CM

## 2021-01-22 ENCOUNTER — Other Ambulatory Visit (HOSPITAL_COMMUNITY): Payer: Self-pay

## 2021-01-22 ENCOUNTER — Other Ambulatory Visit: Payer: Self-pay | Admitting: Family Medicine

## 2021-01-22 MED ORDER — OMEPRAZOLE 20 MG PO CPDR
20.0000 mg | DELAYED_RELEASE_CAPSULE | Freq: Every day | ORAL | 0 refills | Status: DC
  Filled 2021-01-22: qty 90, 90d supply, fill #0

## 2021-01-27 ENCOUNTER — Encounter: Payer: Self-pay | Admitting: Family Medicine

## 2021-02-03 ENCOUNTER — Other Ambulatory Visit: Payer: Self-pay | Admitting: Family Medicine

## 2021-02-03 ENCOUNTER — Other Ambulatory Visit (HOSPITAL_COMMUNITY): Payer: Self-pay

## 2021-02-03 MED ORDER — NEBIVOLOL HCL 5 MG PO TABS
5.0000 mg | ORAL_TABLET | Freq: Every day | ORAL | 0 refills | Status: DC
  Filled 2021-02-03: qty 90, 90d supply, fill #0

## 2021-02-25 ENCOUNTER — Other Ambulatory Visit (INDEPENDENT_AMBULATORY_CARE_PROVIDER_SITE_OTHER): Payer: 59

## 2021-02-25 ENCOUNTER — Other Ambulatory Visit: Payer: Self-pay

## 2021-02-25 DIAGNOSIS — M1009 Idiopathic gout, multiple sites: Secondary | ICD-10-CM | POA: Diagnosis not present

## 2021-02-25 LAB — URIC ACID: Uric Acid, Serum: 5.9 mg/dL (ref 4.0–7.8)

## 2021-03-02 ENCOUNTER — Other Ambulatory Visit: Payer: Self-pay | Admitting: *Deleted

## 2021-03-02 DIAGNOSIS — Z87891 Personal history of nicotine dependence: Secondary | ICD-10-CM

## 2021-03-06 ENCOUNTER — Other Ambulatory Visit: Payer: Self-pay

## 2021-03-06 ENCOUNTER — Ambulatory Visit (INDEPENDENT_AMBULATORY_CARE_PROVIDER_SITE_OTHER)
Admission: RE | Admit: 2021-03-06 | Discharge: 2021-03-06 | Disposition: A | Discharge status: Home / Self Care | Payer: 59 | Source: Ambulatory Visit | Attending: Acute Care | Admitting: Acute Care

## 2021-03-06 DIAGNOSIS — Z87891 Personal history of nicotine dependence: Secondary | ICD-10-CM | POA: Diagnosis not present

## 2021-03-18 ENCOUNTER — Other Ambulatory Visit: Payer: Self-pay | Admitting: Acute Care

## 2021-03-18 DIAGNOSIS — Z87891 Personal history of nicotine dependence: Secondary | ICD-10-CM

## 2021-05-07 ENCOUNTER — Other Ambulatory Visit: Payer: Self-pay | Admitting: Family Medicine

## 2021-05-07 ENCOUNTER — Other Ambulatory Visit (HOSPITAL_COMMUNITY): Payer: Self-pay

## 2021-05-07 MED ORDER — NEBIVOLOL HCL 5 MG PO TABS
5.0000 mg | ORAL_TABLET | Freq: Every day | ORAL | 0 refills | Status: DC
  Filled 2021-05-07: qty 90, 90d supply, fill #0

## 2021-05-18 ENCOUNTER — Encounter: Payer: Self-pay | Admitting: Family Medicine

## 2021-05-20 ENCOUNTER — Ambulatory Visit: Payer: 59 | Admitting: Family Medicine

## 2021-05-20 ENCOUNTER — Other Ambulatory Visit (HOSPITAL_COMMUNITY): Payer: Self-pay

## 2021-05-20 ENCOUNTER — Encounter: Payer: Self-pay | Admitting: Family Medicine

## 2021-05-20 ENCOUNTER — Other Ambulatory Visit: Payer: Self-pay

## 2021-05-20 VITALS — BP 136/82 | HR 73 | Temp 98.0°F | Ht 70.0 in | Wt 169.8 lb

## 2021-05-20 DIAGNOSIS — E785 Hyperlipidemia, unspecified: Secondary | ICD-10-CM | POA: Diagnosis not present

## 2021-05-20 DIAGNOSIS — I10 Essential (primary) hypertension: Secondary | ICD-10-CM

## 2021-05-20 DIAGNOSIS — J439 Emphysema, unspecified: Secondary | ICD-10-CM | POA: Diagnosis not present

## 2021-05-20 DIAGNOSIS — D649 Anemia, unspecified: Secondary | ICD-10-CM | POA: Diagnosis not present

## 2021-05-20 DIAGNOSIS — M1009 Idiopathic gout, multiple sites: Secondary | ICD-10-CM | POA: Diagnosis not present

## 2021-05-20 DIAGNOSIS — K219 Gastro-esophageal reflux disease without esophagitis: Secondary | ICD-10-CM | POA: Diagnosis not present

## 2021-05-20 DIAGNOSIS — D692 Other nonthrombocytopenic purpura: Secondary | ICD-10-CM

## 2021-05-20 DIAGNOSIS — I700 Atherosclerosis of aorta: Secondary | ICD-10-CM

## 2021-05-20 LAB — CBC WITH DIFFERENTIAL/PLATELET
Basophils Absolute: 0 10*3/uL (ref 0.0–0.1)
Basophils Relative: 1 % (ref 0.0–3.0)
Eosinophils Absolute: 0.5 10*3/uL (ref 0.0–0.7)
Eosinophils Relative: 10.9 % — ABNORMAL HIGH (ref 0.0–5.0)
HCT: 39.9 % (ref 39.0–52.0)
Hemoglobin: 13 g/dL (ref 13.0–17.0)
Lymphocytes Relative: 30.7 % (ref 12.0–46.0)
Lymphs Abs: 1.5 10*3/uL (ref 0.7–4.0)
MCHC: 32.6 g/dL (ref 30.0–36.0)
MCV: 91 fl (ref 78.0–100.0)
Monocytes Absolute: 0.5 10*3/uL (ref 0.1–1.0)
Monocytes Relative: 10.3 % (ref 3.0–12.0)
Neutro Abs: 2.3 10*3/uL (ref 1.4–7.7)
Neutrophils Relative %: 47.1 % (ref 43.0–77.0)
Platelets: 214 10*3/uL (ref 150.0–400.0)
RBC: 4.39 Mil/uL (ref 4.22–5.81)
RDW: 13.5 % (ref 11.5–15.5)
WBC: 5 10*3/uL (ref 4.0–10.5)

## 2021-05-20 MED ORDER — OMEPRAZOLE 20 MG PO CPDR
20.0000 mg | DELAYED_RELEASE_CAPSULE | Freq: Every day | ORAL | 3 refills | Status: DC
Start: 2021-05-20 — End: 2022-05-18
  Filled 2021-05-20: qty 90, 90d supply, fill #0
  Filled 2021-08-25: qty 90, 90d supply, fill #1
  Filled 2021-11-18: qty 90, 90d supply, fill #2
  Filled 2022-02-04: qty 90, 90d supply, fill #3

## 2021-05-20 MED ORDER — ROSUVASTATIN CALCIUM 20 MG PO TABS
20.0000 mg | ORAL_TABLET | ORAL | 3 refills | Status: DC
  Filled 2021-05-20: qty 26, 91d supply, fill #0
  Filled 2021-08-14: qty 26, 90d supply, fill #0
  Filled 2021-11-18: qty 26, 90d supply, fill #1
  Filled 2022-03-04: qty 26, 90d supply, fill #2

## 2021-05-20 MED ORDER — NEBIVOLOL HCL 5 MG PO TABS
5.0000 mg | ORAL_TABLET | Freq: Every day | ORAL | 3 refills | Status: DC
  Filled 2021-05-20 – 2021-08-04 (×2): qty 90, 90d supply, fill #0
  Filled 2021-10-30: qty 90, 90d supply, fill #1
  Filled 2022-01-28: qty 90, 90d supply, fill #2
  Filled 2022-04-27: qty 90, 90d supply, fill #3

## 2021-05-20 NOTE — Patient Instructions (Addendum)
Health Maintenance Due  Topic Date Due   Pneumonia Vaccine 50+ Years old (1 - PCV) -  -Due for prevnar-20 vaccination, will think about until now till next visit.   -Will hold off for now Never done   Zoster Vaccines- Shingrix (1 of 2) -  -Will hold off for now Never done   Please stop by lab before you go If you have mychart- we will send your results within 3 business days of [place] receiving them.  If you do not have mychart- we will call you about results within 5 business days of [place] receiving them.  *please also note that you will see labs on mychart as soon as they post. I will later go in and write notes on them- will say "notes from Dr. Yong Channel"  Please make sure to get stool collection kit to check for anemia.   Recommended follow up: Return in about 6 months (around 11/17/2021) for  Please make sure you fast before coming to visit. physical or sooner if needed.

## 2021-05-20 NOTE — Progress Notes (Signed)
Phone [phone] In person visit   Subjective:   Angel Ellis is a 66 y.o. year old very pleasant male patient who presents for/with See problem oriented charting Chief Complaint  Patient presents with   Follow-up   Hyperlipidemia   Hypertension   Gout   Gastroesophageal Reflux   COPD    This visit occurred during the SARS-CoV-2 public health emergency.  Safety protocols were in place, including screening questions prior to the visit, additional usage of staff PPE, and extensive cleaning of exam room while observing appropriate contact time as indicated for disinfecting solutions.   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    Priority: Medium    Former smoker 06/29/2007    Priority: Medium    Hyperlipidemia 02/15/2007    Priority: Medium    Fatty liver 11/08/2019    Priority: Low   Senile purpura ([place]) 11/08/2019    Priority: Low   Allergic rhinitis 07/23/2014    Priority: Low   Rosacea 07/23/2014    Priority: Low   Basal cell carcinoma of skin 01/07/2009    Priority: Low   GERD 02/15/2007    Priority: Low    Medications- reviewed and updated Current Outpatient Medications  Medication Sig Dispense Refill   ALPRAZolam (XANAX) 0.5 MG tablet Take 1 tablet (0.5 mg total) by mouth at bedtime as needed for anxiety. 30 tablet 2   BIOTIN PO Take by mouth.     BLACK ELDERBERRY PO Take by mouth.     Cyanocobalamin (B-12 PO) Take by mouth.     KRILL OIL PO Take by mouth.     MILK THISTLE PO Take by mouth.     Multiple Vitamin (MULTIVITAMIN PO) Take by mouth.     Multiple Vitamins-Minerals (EYE  VITAMINS PO) Take by mouth.     saw palmetto 500 MG capsule Take 500 mg by mouth daily.     VITAMIN E PO Take by mouth.     nebivolol (BYSTOLIC) 5 MG tablet Take 1 tablet (5 mg total) by mouth daily. 90 tablet 3   omeprazole (PRILOSEC) 20 MG capsule Take 1 capsule (20 mg total) by mouth daily. 90 capsule 3   [START ON 05/21/2021] rosuvastatin (CRESTOR) 20 MG tablet Take 1 tablet (20 mg total) by mouth 2 (two) times a week. 26 tablet 3   No current facility-administered medications for this visit.     Objective:  BP 136/82 Comment: retake in office   Pulse 73    Temp 98 F (36.7 C) (Temporal)    Ht 5\' 10"  (1.778 m)    Wt 169 lb 12.8 oz (77 kg)    SpO2 98%    BMI 24.36 kg/m  Gen: NAD, resting comfortably CV: RRR no murmurs rubs or gallops Lungs: CTAB no crackles, wheeze, rhonchi Ext: no edema Skin: warm, dry     Assessment and Plan   #hypertension S: medication: Bystolic 5 mg daily Home readings #s: 120/80 on most recent check at work BP Readings from Last 3 Encounters:  05/20/21  136/82  12/19/20 114/79  11/27/20 136/84  A/P: Well-controlled on repeat-continue current medication   #hyperlipidemia/aortic atherosclerosis S: Medication:rosuvastatin 20 mg twice weekly.  He also takes Krill oil - slight weight loss from last visit and down 6 lbs from last year at this time- walking 10 miles a day with work,  trying to eat healthy diet Lab Results  Component Value Date   CHOL 197 11/13/2020   HDL 76.10 11/13/2020   LDLCALC 115 (H) 11/04/2017   LDLDIRECT 70.0 11/13/2020   TRIG 390.0 (H) 11/13/2020   CHOLHDL 3 11/13/2020   A/P: Has been very well controlled in regards to LDL-can work on dietary changes to bring triglycerides down-we discussed needing to be fasting for 94-month physical  Aortic atherosclerosis likely stable- continue risk factor modification  #Prior anemia on 12/25/2020 visit (repeated over 2 weeks and improved some)-was positive for blood in the stool at that time  as well.  Anemia slightly improved on recheck.  Patient was on some different medications (prednisone for gout flare) at that time and he is now off-he would like to recheck before seeing GI-previously referred but he had wanted to hold off.  He is otherwise up-to-date on colonoscopy- Did have tubular adenoma/precancerous polyp 01/29/2020   #Gout S: 0  flares in last 6  months previously - no won P[place]issewa 450mg  Medications: Colchicine 0.6 mg in the past- - was not helping anymore- celery had been very helpfulif flare - urcinol had helped prevent flares. Prednisone works for flares Lab Results  Component Value Date   LABURIC 5.9 02/25/2021  A/P:Doing well without flares-continue to monitor. Uric acid was under 6 on current meds in october    # B12 deficiency S: Current treatment/medication (oral vs. IM):  1000 mcg per day Lab Results  Component Value Date   VITAMINB12 999 (H) 11/13/2020  A/P: Well-controlled-continue oral supplement  # GERD S:Medication: Omeprazole 20 mg each AM.  We discussed possible Pepcid but this did not work for him  A/P: good control and failed pepcid- continue current meds    #COPD-noted on lung cancer screening program.  Asymptomatic-continue to monitor without medication - remains cigarette free    #foot pain with prolonged walking- much better now. Hoka's help especially when rotates. No recent diclofenac needed.    #insomnia- xanax remains helpful when needed- half tablet before bed at times. Anywhere from 1-4x a week in past but hasnt needed lately. Discussed avoiding alcohol with this and minimizing use- he has done a good job minimizing use  #senile purpura- noted. Stable. Check cbc at least annually-platelets normal on last check   #reported some curvature with erections- usually at night- no pain thankfully and not sexually active. Had not worsened over last year- no pain since last visit   Health Maintenance Due  Topic Date Due   Pneumonia Vaccine  95+ Years old (1 - PCV)- wants to hold off for now on both vaccines Never done   Zoster Vaccines- Shingrix (1 of 2) Never done   Recommended follow up: Return in about 6 months (around 11/17/2021) for follow-up or sooner if needed. Please make sure you fast before coming to visit. .  Lab/Order associations:   ICD-10-CM   1. Hyperlipidemia, unspecified hyperlipidemia type  E78.5     2. Essential hypertension  I10     3. Gastroesophageal reflux disease without esophagitis  K21.9     4. Acute idiopathic gout of multiple sites  M10.09     5. Aortic atherosclerosis (  HCC)  I70.0     6. Anemia, unspecified type  D64.9 CBC with Differential/Platelet    Fecal occult blood, imunochemical      Meds ordered this encounter  Medications   nebivolol (BYSTOLIC) 5 MG tablet    Sig: Take 1 tablet (5 mg total) by mouth daily.    Dispense:  90 tablet    Refill:  3   omeprazole (PRILOSEC) 20 MG capsule    Sig: Take 1 capsule (20 mg total) by mouth daily.    Dispense:  90 capsule    Refill:  3   rosuvastatin (CRESTOR) 20 MG tablet    Sig: Take 1 tablet (20 mg total) by mouth 2 (two) times a week.    Dispense:  26 tablet    Refill:  3    I,Jada Bradford,acting as a scribe for Garret Reddish, MD.,have documented all relevant documentation on the behalf of Garret Reddish, MD,as directed by  Garret Reddish, MD while in the presence of Garret Reddish, MD.   I, Garret Reddish, MD, have reviewed all documentation for this visit. The documentation on 05/20/21 for the exam, diagnosis, procedures, and orders are all accurate and complete.   Return precautions advised.  Garret Reddish, MD

## 2021-07-01 ENCOUNTER — Other Ambulatory Visit (INDEPENDENT_AMBULATORY_CARE_PROVIDER_SITE_OTHER): Payer: 59

## 2021-07-01 DIAGNOSIS — D649 Anemia, unspecified: Secondary | ICD-10-CM | POA: Diagnosis not present

## 2021-07-01 LAB — FECAL OCCULT BLOOD, IMMUNOCHEMICAL: Fecal Occult Bld: NEGATIVE

## 2021-08-04 ENCOUNTER — Other Ambulatory Visit (HOSPITAL_COMMUNITY): Payer: Self-pay

## 2021-08-04 ENCOUNTER — Other Ambulatory Visit: Payer: Self-pay | Admitting: Family Medicine

## 2021-08-04 MED ORDER — ALPRAZOLAM 0.5 MG PO TABS
0.5000 mg | ORAL_TABLET | Freq: Every evening | ORAL | 2 refills | Status: DC | PRN
  Filled 2021-08-04: qty 30, 30d supply, fill #0
  Filled 2021-11-18: qty 30, 30d supply, fill #1

## 2021-08-14 ENCOUNTER — Other Ambulatory Visit (HOSPITAL_COMMUNITY): Payer: Self-pay

## 2021-08-17 ENCOUNTER — Other Ambulatory Visit (HOSPITAL_COMMUNITY): Payer: Self-pay

## 2021-08-25 ENCOUNTER — Other Ambulatory Visit (HOSPITAL_COMMUNITY): Payer: Self-pay

## 2021-10-28 ENCOUNTER — Other Ambulatory Visit: Payer: Self-pay | Admitting: Nurse Practitioner

## 2021-10-28 ENCOUNTER — Other Ambulatory Visit: Payer: Self-pay

## 2021-10-28 ENCOUNTER — Ambulatory Visit: Payer: Self-pay

## 2021-10-28 DIAGNOSIS — M79671 Pain in right foot: Secondary | ICD-10-CM

## 2021-10-30 ENCOUNTER — Other Ambulatory Visit (HOSPITAL_COMMUNITY): Payer: Self-pay

## 2021-11-18 ENCOUNTER — Other Ambulatory Visit (HOSPITAL_COMMUNITY): Payer: Self-pay | Admitting: Physician Assistant

## 2021-11-18 ENCOUNTER — Other Ambulatory Visit (HOSPITAL_COMMUNITY): Payer: Self-pay

## 2021-11-25 ENCOUNTER — Encounter: Payer: Self-pay | Admitting: Family Medicine

## 2021-11-26 ENCOUNTER — Ambulatory Visit (INDEPENDENT_AMBULATORY_CARE_PROVIDER_SITE_OTHER): Payer: 59 | Admitting: Family Medicine

## 2021-11-26 ENCOUNTER — Encounter: Payer: Self-pay | Admitting: Family Medicine

## 2021-11-26 VITALS — BP 110/64 | HR 65 | Temp 98.6°F | Ht 70.0 in | Wt 173.0 lb

## 2021-11-26 DIAGNOSIS — Z125 Encounter for screening for malignant neoplasm of prostate: Secondary | ICD-10-CM

## 2021-11-26 DIAGNOSIS — K760 Fatty (change of) liver, not elsewhere classified: Secondary | ICD-10-CM | POA: Diagnosis not present

## 2021-11-26 DIAGNOSIS — E785 Hyperlipidemia, unspecified: Secondary | ICD-10-CM

## 2021-11-26 DIAGNOSIS — J439 Emphysema, unspecified: Secondary | ICD-10-CM | POA: Diagnosis not present

## 2021-11-26 DIAGNOSIS — D692 Other nonthrombocytopenic purpura: Secondary | ICD-10-CM

## 2021-11-26 DIAGNOSIS — Z87891 Personal history of nicotine dependence: Secondary | ICD-10-CM | POA: Diagnosis not present

## 2021-11-26 DIAGNOSIS — I10 Essential (primary) hypertension: Secondary | ICD-10-CM | POA: Diagnosis not present

## 2021-11-26 DIAGNOSIS — M1009 Idiopathic gout, multiple sites: Secondary | ICD-10-CM

## 2021-11-26 DIAGNOSIS — Z0000 Encounter for general adult medical examination without abnormal findings: Secondary | ICD-10-CM

## 2021-11-26 DIAGNOSIS — E538 Deficiency of other specified B group vitamins: Secondary | ICD-10-CM

## 2021-11-26 DIAGNOSIS — I700 Atherosclerosis of aorta: Secondary | ICD-10-CM

## 2021-11-26 LAB — LIPID PANEL
Cholesterol: 189 mg/dL (ref 0–200)
HDL: 92.8 mg/dL (ref >39.00)
LDL Cholesterol: 85 mg/dL (ref 0–99)
NonHDL: 96.45
Total CHOL/HDL Ratio: 2
Triglycerides: 57 mg/dL (ref 0.0–149.0)
VLDL: 11.4 mg/dL (ref 0.0–40.0)

## 2021-11-26 LAB — COMPREHENSIVE METABOLIC PANEL
ALT: 62 U/L — ABNORMAL HIGH (ref 0–53)
AST: 55 U/L — ABNORMAL HIGH (ref 0–37)
Albumin: 4.4 g/dL (ref 3.5–5.2)
Alkaline Phosphatase: 45 U/L (ref 39–117)
BUN: 12 mg/dL (ref 6–23)
CO2: 28 mEq/L (ref 19–32)
Calcium: 8.8 mg/dL (ref 8.4–10.5)
Chloride: 100 mEq/L (ref 96–112)
Creatinine, Ser: 0.69 mg/dL (ref 0.40–1.50)
GFR: 96.53 mL/min (ref >60.00)
Glucose, Bld: 82 mg/dL (ref 70–99)
Potassium: 4.1 mEq/L (ref 3.5–5.1)
Sodium: 138 mEq/L (ref 135–145)
Total Bilirubin: 0.7 mg/dL (ref 0.2–1.2)
Total Protein: 7.2 g/dL (ref 6.0–8.3)

## 2021-11-26 LAB — CBC WITH DIFFERENTIAL/PLATELET
Basophils Absolute: 0 10*3/uL (ref 0.0–0.1)
Basophils Relative: 0.5 % (ref 0.0–3.0)
Eosinophils Absolute: 0.4 10*3/uL (ref 0.0–0.7)
Eosinophils Relative: 7.8 % — ABNORMAL HIGH (ref 0.0–5.0)
HCT: 39.4 % (ref 39.0–52.0)
Hemoglobin: 13.2 g/dL (ref 13.0–17.0)
Lymphocytes Relative: 32.7 % (ref 12.0–46.0)
Lymphs Abs: 1.5 10*3/uL (ref 0.7–4.0)
MCHC: 33.5 g/dL (ref 30.0–36.0)
MCV: 91.8 fl (ref 78.0–100.0)
Monocytes Absolute: 0.5 10*3/uL (ref 0.1–1.0)
Monocytes Relative: 10.9 % (ref 3.0–12.0)
Neutro Abs: 2.2 10*3/uL (ref 1.4–7.7)
Neutrophils Relative %: 48.1 % (ref 43.0–77.0)
Platelets: 174 10*3/uL (ref 150.0–400.0)
RBC: 4.29 Mil/uL (ref 4.22–5.81)
RDW: 12.8 % (ref 11.5–15.5)
WBC: 4.6 10*3/uL (ref 4.0–10.5)

## 2021-11-26 LAB — POC URINALSYSI DIPSTICK (AUTOMATED)
Bilirubin, UA: NEGATIVE
Blood, UA: NEGATIVE
Glucose, UA: NEGATIVE
Ketones, UA: NEGATIVE
Leukocytes, UA: NEGATIVE
Nitrite, UA: NEGATIVE
Protein, UA: NEGATIVE
Spec Grav, UA: <=1.005 — AB (ref 1.010–1.025)
Urobilinogen, UA: 0.2 E.U./dL
pH, UA: 6 (ref 5.0–8.0)

## 2021-11-26 LAB — PSA: PSA: 0.93 ng/mL (ref 0.10–4.00)

## 2021-11-26 LAB — VITAMIN B12: Vitamin B-12: 1200 pg/mL — ABNORMAL HIGH (ref 211–911)

## 2021-11-26 LAB — URIC ACID: Uric Acid, Serum: 6.1 mg/dL (ref 4.0–7.8)

## 2021-11-26 NOTE — Progress Notes (Signed)
Phone: [phone]   Subjective:  Patient presents today for their annual physical. Chief complaint-noted.   See problem oriented charting- ROS- full  review of systems was completed and negative  except for: lateral hip joint pain (working with chiropractor- once a month), allergies- pnd, sinus pressure, sneezing  The following were reviewed and entered/updated in epic: Past Medical History:  Diagnosis Date   Allergy    seasonal allergies   Diverticulitis of colon 10/29/2009   After august 1995     Diverticulosis    Emphysema of lung ([place])    not on any meds-past smoker   GERD (gastroesophageal reflux disease)    on meds   Gout    Hypertension    Lipoma of neck    L neck-remains in place   Nasal congestion    Sebaceous cyst 01/07/2009   R axilla     Patient Active Problem List   Diagnosis Date Noted   B12 deficiency 05/12/2020    Priority: Medium    Emphysema of lung ([place]) 11/08/2019    Priority: Medium    Aortic atherosclerosis ([place]) 11/07/2018    Priority: Medium    Insomnia 08/23/2017    Priority: Medium    Gout 07/23/2014    Priority: Medium    Essential hypertension 07/23/2014    Priority: Medium    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    Priority: Medium    Former smoker 06/29/2007    Priority: Medium    Hyperlipidemia 02/15/2007    Priority: Medium    Fatty liver 11/08/2019    Priority: Low   Senile purpura ([place]) 11/08/2019    Priority: Low   Allergic rhinitis 07/23/2014    Priority: Low   Rosacea 07/23/2014    Priority: Low   Basal cell carcinoma of skin 01/07/2009    Priority: Low   GERD 02/15/2007    Priority: Low   Past Surgical History:  Procedure Laterality Date   COLONOSCOPY  2010   TICS/hems   none     WISDOM TOOTH EXTRACTION  1980    Family History  Problem Relation Age of Onset   Heart disease Mother        stopped psychiatric meds and BP meds, later with MI   Hypertension Mother    Schizophrenia Mother    Heart disease Father         sleep apnea not controlled. 74.    Colon polyps Neg Hx    Colon cancer Neg Hx    Esophageal cancer Neg Hx    Rectal cancer Neg Hx    Stomach cancer Neg Hx     Medications- reviewed and updated Current Outpatient Medications  Medication Sig Dispense Refill   ALPRAZolam (XANAX) 0.5 MG tablet Take 1 tablet (0.5 mg total) by mouth at bedtime as needed for anxiety (do not drive for 8 hours after taking). 30 tablet 2   BIOTIN PO Take by mouth.     BLACK ELDERBERRY PO Take by mouth.     Cyanocobalamin (B-12 PO) Take by mouth.     KRILL OIL PO Take by mouth.     MILK THISTLE PO Take by mouth.     Multiple Vitamin (MULTIVITAMIN PO) Take by mouth.     Multiple Vitamins-Minerals (EYE VITAMINS PO) Take by mouth.     nebivolol (BYSTOLIC) 5 MG tablet Take 1 tablet (5 mg total) by mouth daily. 90 tablet 3   omeprazole (PRILOSEC) 20 MG capsule Take 1 capsule (20 mg total) by mouth  daily. 90 capsule 3   rosuvastatin (CRESTOR) 20 MG tablet Take 1 tablet (20 mg total) by mouth 2 (two) times a week. 26 tablet 3   saw palmetto 500 MG capsule Take 500 mg by mouth daily.     VITAMIN E PO Take by mouth.     No current facility-administered medications for this visit.    Allergies-reviewed and updated Allergies  Allergen Reactions   Aspirin Other (See Comments)    Abdominal cramping, gi discomfort   Milk-Related Compounds     GI Symptoms   Penicillins Palpitations    Makes heart race    Social History   Social History Narrative   Domestic partner, not sexually active [name] also a patient here). No kids.       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    Objective  Objective:  BP 110/64   Pulse 65   Temp 98.6 F (37 C)   Ht '5\' 10"'$  (1.778 m)   Wt 173 lb (78.5 kg)   SpO2 96%   BMI 24.82 kg/m  Gen: NAD, resting comfortably HEENT: Mucous membranes are moist. Oropharynx normal Neck: no thyromegaly CV: RRR no murmurs rubs or  gallops Lungs: CTAB no crackles, wheeze, rhonchi Abdomen: soft/nontender/nondistended/normal bowel sounds. No rebound or guarding.  Ext: no edema Skin: warm, dry Neuro: grossly normal, moves all extremities, PERRLA   Assessment and Plan  66 y.o. male presenting for annual physical.  Health Maintenance counseling: 1. Anticipatory guidance: Patient counseled regarding regular dental exams -q6 months, eye exams - plans to update- stable floater for 30 years,  avoiding smoking and second hand smoke , limiting alcohol to 2 beverages per day , no illicit drugs .   2. Risk factor reduction:  Advised patient of need for regular exercise and diet rich and fruits and vegetables to reduce risk of heart attack and stroke.  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  Diet/weight management- Weight largely stable Wt Readings from Last 3 Encounters:  11/26/21 173 lb (78.5 kg)  05/20/21 169 lb 12.8 oz (77 kg)  11/13/20 171 lb 12.8 oz (77.9 kg)  3. Immunizations/screenings/ancillary studies- prevnar 20 wants to hold off on. Declines shingrix for now and covid 19 vaccination.   Immunization History  Administered Date(s) Administered   Influenza,inj,Quad PF,6+ Mos 01/08/2013   Influenza-Unspecified 02/23/2015, 02/08/2020   PFIZER(Purple Top)SARS-COV-2 Vaccination 05/02/2019, 05/23/2019   Td 08/08/2005, 07/07/2016   Tdap 10/03/2015  4. Prostate cancer screening- low risk prior PSA trend-update with labs today Lab Results  Component Value Date   PSA 0.79 11/13/2020   PSA 0.78 11/08/2019   PSA 1.13 11/07/2018   5. Colon cancer screening - history of tubular adenoma-01/29/2020 with 7 year repeat planned 6. Skin cancer screening- Dr. Ronnald Ramp years ago  for rosacea. advised regular sunscreen use. Denies worrisome, changing, or new skin lesions.  7. Smoking associated screening (lung cancer screening, AAA screen 65-75, UA)- former smoker-  over 30 pack years. In lung cancer screening  program. Wants to wait on aaa screen until medicare. UA today 8. STD screening - not sexually active  Status of chronic or acute concerns   #hypertension S: medication: Nebivolol 5 mg Home readings #s: readings at home usually 120s or 130s/high 70s to 80s A/P: Controlled. Continue current medications.   #hyperlipidemia #aortic atherosclerosis S: Medication:rosuvastatin 20 mg twice  a week Lab Results  Component Value Date  CHOL 197 11/13/2020   HDL 76.10 11/13/2020   LDLCALC 115 (H) 11/04/2017   LDLDIRECT 70.0 11/13/2020   TRIG 390.0 (H) 11/13/2020   CHOLHDL 3 11/13/2020  A/P: hopefully stable or improved on triglycerides- update lipid panel today. Continue current meds for now   #Gout S: pipsissewa and celery seed- no significant flares Lab Results  Component Value Date   LABURIC 5.9 02/25/2021  A/P:reasonable control- continue to monitor   # GERD S:Medication:  omeprazole 20 mg -failed pepcid trial A/P: Controlled. Continue current medications.    # B12 deficiency S: Current treatment/medication (oral vs. IM): Cyanocobalamin otc SL  A/P: hopefully stable- update b12 today. Continue current meds for now   #emphysema-incidental finding-asymptomatic- will monitor   #Insomnia-controlled with alprazolam as needed- rarely thankfully   #senile purpura- noted. Stable. Check cbc at least annually   Recommended follow up: Return in about 6 months (around 05/29/2022) for followup or sooner if needed.Schedule b4 you leave.  Lab/Order associations:NOT fasting- half biscuit this am   ICD-10-CM   1. Preventative health care  Z00.00     2. Hyperlipidemia, unspecified hyperlipidemia type  E78.5     3. Acute idiopathic gout of multiple sites  M10.09     4. Essential hypertension  I10     5. Pulmonary emphysema, unspecified emphysema type ([address])  J43.9     6. B12 deficiency  E53.8     7. Senile purpura ([facility])  D69.2     8. Fatty liver  K76.0     9. Aortic  atherosclerosis ([facility])  I70.0     10. Screening for prostate cancer  Z12.5     11. Former smoker  Z87.891       No orders of the defined types were placed in this encounter.   Return precautions advised.  Garret Reddish, MD

## 2021-11-26 NOTE — Patient Instructions (Addendum)
Please stop by lab before you go If you have mychart- we will send your results within 3 business days of [place] receiving them.  If you do not have mychart- we will call you about results within 5 business days of [place] receiving them.  *please also note that you will see labs on mychart as soon as they post. I will later go in and write notes on them- will say "notes from Dr. Yong Channel"   Recommended follow up: Return in about 6 months (around 05/29/2022) for followup or sooner if needed.Schedule b4 you leave.

## 2022-01-20 ENCOUNTER — Encounter: Payer: Self-pay | Admitting: Family Medicine

## 2022-01-21 ENCOUNTER — Other Ambulatory Visit: Payer: Self-pay

## 2022-01-21 DIAGNOSIS — M25551 Pain in right hip: Secondary | ICD-10-CM

## 2022-01-27 NOTE — Progress Notes (Unsigned)
Angel Ellis [address] [place] Phone: [phone] Subjective:   IVilma Ellis, am serving as a scribe for Dr. Hulan Saas.  I'm seeing this patient by the request  of:  Angel Olp, MD  CC: left (right) hip pain   HUD:[id number]  Angel Ellis is a 66 y.o. male coming in with complaint of L hip pain. Has had trouble with both hips, but left is worse. Only in the AM does he feel the intense pain. Sharp and pinching in nature that is exacerbated by back flexion. Pain dissipates during the day. Location of pain is over greater trochanter. Starting to feel some pressure in his lower leg on the left side. Walks 10 miles a day.       Past Medical History:  Diagnosis Date   Allergy    seasonal allergies   Diverticulitis of colon 10/29/2009   After august 1995     Diverticulosis    Emphysema of lung ([place])    not on any meds-past smoker   GERD (gastroesophageal reflux disease)    on meds   Gout    Hypertension    Lipoma of neck    L neck-remains in place   Nasal congestion    Sebaceous cyst 01/07/2009   R axilla     Past Surgical History:  Procedure Laterality Date   COLONOSCOPY  2010   TICS/hems   none     WISDOM TOOTH EXTRACTION  1980   Social History   Socioeconomic History   Marital status: Single    Spouse name: Not on file   Number of children: Not on file   Years of education: Not on file   Highest education level: Not on file  Occupational History   Not on file  Tobacco Use   Smoking status: Former    Packs/day: 1.00    Years: 37.00    Total pack years: 37.00    Types: Cigarettes    Quit date: 2011    Years since quitting: 12.7   Smokeless tobacco: Never  Vaping Use   Vaping Use: Never used  Substance and Sexual Activity   Alcohol use: Yes    Alcohol/week: 21.0 standard drinks of alcohol    Types: 21 Standard drinks or equivalent per week    Comment: one per day   Drug use: No   Sexual  activity: Never  Other Topics Concern   Not on file  Social History Narrative   Domestic partner, not sexually active Angel Ellis also a patient here). No kids.       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    Social Determinants of Health   Financial Resource Strain: Not on file  Food Insecurity: Not on file  Transportation Needs: Not on file  Physical Activity: Not on file  Stress: Not on file  Social Connections: Not on file   Allergies  Allergen Reactions   Aspirin Other (See Comments)    Abdominal cramping, gi discomfort   Milk-Related Compounds     GI Symptoms   Penicillins Palpitations    Makes heart race   Family History  Problem Relation Age of Onset   Heart disease Mother        stopped psychiatric meds and BP meds, later with MI   Hypertension Mother    Schizophrenia Mother    Heart disease Father  sleep apnea not controlled. 74.    Colon polyps Neg Hx    Colon cancer Neg Hx    Esophageal cancer Neg Hx    Rectal cancer Neg Hx    Stomach cancer Neg Hx      Current Outpatient Medications (Cardiovascular):    rosuvastatin (CRESTOR) 20 MG tablet, Take 1 tablet (20 mg total) by mouth 2 (two) times a week.   nebivolol (BYSTOLIC) 5 MG tablet, Take 1 tablet (5 mg total) by mouth daily.    Current Outpatient Medications (Hematological):    Cyanocobalamin (B-12 PO), Take by mouth.  Current Outpatient Medications (Other):    ALPRAZolam (XANAX) 0.5 MG tablet, Take 1 tablet (0.5 mg total) by mouth at bedtime as needed for anxiety (do not drive for 8 hours after taking).   omeprazole (PRILOSEC) 20 MG capsule, Take 1 capsule (20 mg total) by mouth daily.   BIOTIN PO, Take by mouth.   BLACK ELDERBERRY PO, Take by mouth.   KRILL OIL PO, Take by mouth.   MILK THISTLE PO, Take by mouth.   Multiple Vitamin (MULTIVITAMIN PO), Take by mouth.   Multiple Vitamins-Minerals (EYE VITAMINS PO), Take by mouth.   saw  palmetto 500 MG capsule, Take 500 mg by mouth daily.   VITAMIN E PO, Take by mouth.   Reviewed prior external information including notes and imaging from  primary care provider As well as notes that were available from care everywhere and other healthcare systems.  Past medical history, social, surgical and family history all reviewed in electronic medical record.  No pertanent information unless stated regarding to the chief complaint.   Review of Systems:  No headache, visual changes, nausea, vomiting, diarrhea, constipation, dizziness, abdominal pain, skin rash, fevers, chills, night sweats, weight loss, swollen lymph nodes, body aches, joint swelling, chest pain, shortness of breath, mood changes. POSITIVE muscle aches  Objective  Blood pressure (!) 140/90, pulse 77, height '5\' 10"'$  (1.778 m), weight 172 lb (78 kg), SpO2 97 %.   General: No apparent distress alert and oriented x3 mood and affect normal, dressed appropriately.  HEENT: Pupils equal, extraocular movements intact  Respiratory: Patient's speak in full sentences and does not appear short of breath  Cardiovascular: No lower extremity edema, non tender, no erythema  Back exam does have some loss of lordosis.  Patient does have some tenderness to palpation noted more over the left greater trochanteric area.  Patient does have tightness noted with FABER left greater than right.  Negative straight leg test.  Patient does have some mild loss of lordosis of the lumbar spine.  Osteopathic findings  T5 extended rotated and side bent left L5 flexed rotated and side bent left Sacrum right on right     Impression and Recommendations:    The above documentation has been reviewed and is accurate and complete Angel Pulley, DO

## 2022-01-28 ENCOUNTER — Ambulatory Visit: Payer: 59 | Admitting: Family Medicine

## 2022-01-28 ENCOUNTER — Ambulatory Visit: Payer: Self-pay

## 2022-01-28 ENCOUNTER — Other Ambulatory Visit (HOSPITAL_COMMUNITY): Payer: Self-pay

## 2022-01-28 VITALS — BP 140/90 | HR 77 | Ht 70.0 in | Wt 172.0 lb

## 2022-01-28 DIAGNOSIS — M25552 Pain in left hip: Secondary | ICD-10-CM | POA: Diagnosis not present

## 2022-01-28 DIAGNOSIS — M5442 Lumbago with sciatica, left side: Secondary | ICD-10-CM | POA: Diagnosis not present

## 2022-01-28 DIAGNOSIS — G8929 Other chronic pain: Secondary | ICD-10-CM

## 2022-01-28 DIAGNOSIS — M9903 Segmental and somatic dysfunction of lumbar region: Secondary | ICD-10-CM

## 2022-01-28 DIAGNOSIS — M9902 Segmental and somatic dysfunction of thoracic region: Secondary | ICD-10-CM

## 2022-01-28 DIAGNOSIS — M545 Low back pain, unspecified: Secondary | ICD-10-CM | POA: Insufficient documentation

## 2022-01-28 DIAGNOSIS — M9904 Segmental and somatic dysfunction of sacral region: Secondary | ICD-10-CM

## 2022-01-28 NOTE — Assessment & Plan Note (Signed)
   Decision today to treat with OMT was based on Physical Exam  After verbal consent patient was treated with HVLA, ME, FPR techniques in , thoracic,  lumbar and sacral areas, all areas are chronic   Patient tolerated the procedure well with improvement in symptoms  Patient given exercises, stretches and lifestyle modifications  See medications in patient instructions if given  Patient will follow up in 4-8 weeks 

## 2022-01-28 NOTE — Assessment & Plan Note (Signed)
Bilateral low back pain noted.  Patient seems to have some mild greater trochanteric bursitis as well.  We we will start with conservative therapy with hip abductor strengthening, started osteopathic manipulation.  Discussed worsening pain I do feel that advanced imaging may be warranted.  Patient wants to hold on any type of prescription medications if possible.  Could be a candidate for gabapentin.  Follow-up again in 6 weeks

## 2022-01-28 NOTE — Patient Instructions (Addendum)
Nice to meet you Do prescribed exercises at least 3x a week Icing at night Tart Cherry '1200mg'$  at night Vit D up intake to 2000iu daily

## 2022-02-01 ENCOUNTER — Encounter: Payer: Self-pay | Admitting: *Deleted

## 2022-02-04 ENCOUNTER — Other Ambulatory Visit (HOSPITAL_COMMUNITY): Payer: Self-pay

## 2022-02-04 ENCOUNTER — Other Ambulatory Visit: Payer: Self-pay | Admitting: Family Medicine

## 2022-02-04 MED ORDER — ALPRAZOLAM 0.5 MG PO TABS
0.5000 mg | ORAL_TABLET | Freq: Every evening | ORAL | 2 refills | Status: DC | PRN
  Filled 2022-02-04: qty 30, 30d supply, fill #0
  Filled 2022-04-27: qty 30, 30d supply, fill #1
  Filled 2022-07-22: qty 30, 30d supply, fill #2

## 2022-02-05 ENCOUNTER — Other Ambulatory Visit (HOSPITAL_COMMUNITY): Payer: Self-pay

## 2022-02-08 DIAGNOSIS — M9903 Segmental and somatic dysfunction of lumbar region: Secondary | ICD-10-CM | POA: Diagnosis not present

## 2022-02-08 DIAGNOSIS — M5442 Lumbago with sciatica, left side: Secondary | ICD-10-CM | POA: Diagnosis not present

## 2022-02-15 DIAGNOSIS — M9903 Segmental and somatic dysfunction of lumbar region: Secondary | ICD-10-CM | POA: Diagnosis not present

## 2022-02-15 DIAGNOSIS — M5442 Lumbago with sciatica, left side: Secondary | ICD-10-CM | POA: Diagnosis not present

## 2022-02-18 DIAGNOSIS — M5442 Lumbago with sciatica, left side: Secondary | ICD-10-CM | POA: Diagnosis not present

## 2022-02-18 DIAGNOSIS — M9903 Segmental and somatic dysfunction of lumbar region: Secondary | ICD-10-CM | POA: Diagnosis not present

## 2022-02-22 DIAGNOSIS — M9903 Segmental and somatic dysfunction of lumbar region: Secondary | ICD-10-CM | POA: Diagnosis not present

## 2022-02-22 DIAGNOSIS — M5442 Lumbago with sciatica, left side: Secondary | ICD-10-CM | POA: Diagnosis not present

## 2022-02-24 DIAGNOSIS — M5442 Lumbago with sciatica, left side: Secondary | ICD-10-CM | POA: Diagnosis not present

## 2022-02-24 DIAGNOSIS — M9903 Segmental and somatic dysfunction of lumbar region: Secondary | ICD-10-CM | POA: Diagnosis not present

## 2022-03-02 DIAGNOSIS — M9903 Segmental and somatic dysfunction of lumbar region: Secondary | ICD-10-CM | POA: Diagnosis not present

## 2022-03-02 DIAGNOSIS — M5442 Lumbago with sciatica, left side: Secondary | ICD-10-CM | POA: Diagnosis not present

## 2022-03-04 ENCOUNTER — Other Ambulatory Visit (HOSPITAL_COMMUNITY): Payer: Self-pay

## 2022-03-04 DIAGNOSIS — M9903 Segmental and somatic dysfunction of lumbar region: Secondary | ICD-10-CM | POA: Diagnosis not present

## 2022-03-04 DIAGNOSIS — M5442 Lumbago with sciatica, left side: Secondary | ICD-10-CM | POA: Diagnosis not present

## 2022-03-08 ENCOUNTER — Ambulatory Visit (HOSPITAL_COMMUNITY)
Admission: RE | Admit: 2022-03-08 | Discharge: 2022-03-08 | Disposition: A | Discharge status: Home / Self Care | Payer: 59 | Source: Ambulatory Visit | Attending: Acute Care | Admitting: Acute Care

## 2022-03-08 DIAGNOSIS — J439 Emphysema, unspecified: Secondary | ICD-10-CM | POA: Diagnosis not present

## 2022-03-08 DIAGNOSIS — K769 Liver disease, unspecified: Secondary | ICD-10-CM | POA: Diagnosis not present

## 2022-03-08 DIAGNOSIS — Z87891 Personal history of nicotine dependence: Secondary | ICD-10-CM | POA: Insufficient documentation

## 2022-03-08 DIAGNOSIS — M9903 Segmental and somatic dysfunction of lumbar region: Secondary | ICD-10-CM | POA: Diagnosis not present

## 2022-03-08 DIAGNOSIS — Z122 Encounter for screening for malignant neoplasm of respiratory organs: Secondary | ICD-10-CM | POA: Diagnosis not present

## 2022-03-08 DIAGNOSIS — I700 Atherosclerosis of aorta: Secondary | ICD-10-CM | POA: Insufficient documentation

## 2022-03-08 DIAGNOSIS — M5442 Lumbago with sciatica, left side: Secondary | ICD-10-CM | POA: Diagnosis not present

## 2022-03-09 ENCOUNTER — Telehealth: Payer: Self-pay

## 2022-03-09 NOTE — Telephone Encounter (Signed)
Please advise on following call report:   CLINICAL DATA:  Former smoker with 37 pack-year history   EXAM: CT CHEST WITHOUT CONTRAST LOW-DOSE FOR LUNG CANCER SCREENING   TECHNIQUE: Multidetector CT imaging of the chest was performed following the standard protocol without IV contrast.   RADIATION DOSE REDUCTION: This exam was performed according to the departmental dose-optimization program which includes automated exposure control, adjustment of the mA and/or kV according to patient size and/or use of iterative reconstruction technique.   COMPARISON:  Lung cancer screening CT dated March 06, 2021   FINDINGS: Cardiovascular: Normal heart size. No pericardial effusion. Caliber thoracic aorta with mild calcified plaque. No visible coronary artery calcifications.   Mediastinum/Nodes: Esophagus and thyroid are unremarkable. No pathologically enlarged lymph nodes seen in the chest.   Lungs/Pleura: Central airways are patent. Mild centrilobular emphysema. No consolidation, pleural effusion or pneumothorax. Stable small bilateral solid pulmonary nodules, reference nodule of the left lower lobe measuring 2.5 mm in mean diameter on image 191.   Upper Abdomen: Hepatic steatosis. New irregular liver lesions measuring 4.7 x 3.6 cm in the right lobe on image 58 and 3.0 x 2.3 cm on image 62 in the central liver.   Musculoskeletal: No chest wall mass or suspicious bone lesions identified.   IMPRESSION: 1. Lung-RADS 2S, benign appearance or behavior. Continue annual screening with low-dose chest CT without contrast in 12 months. S modifier for new liver lesions. 2. New irregular liver lesions, concerning for primary liver malignancy or metastatic disease. Recommend contrast-enhanced abdominal MRI and/or CT of the abdomen and pelvis for further evaluation. 3. Aortic Atherosclerosis (ICD10-I70.0) and Emphysema (ICD10-J43.9).   These results will be called to the ordering clinician  or representative by the Radiologist Assistant, and communication documented in the PACS or [employer].  Thank you

## 2022-03-11 DIAGNOSIS — M5442 Lumbago with sciatica, left side: Secondary | ICD-10-CM | POA: Diagnosis not present

## 2022-03-11 DIAGNOSIS — M9903 Segmental and somatic dysfunction of lumbar region: Secondary | ICD-10-CM | POA: Diagnosis not present

## 2022-03-12 ENCOUNTER — Telehealth: Payer: Self-pay | Admitting: Acute Care

## 2022-03-12 DIAGNOSIS — K769 Liver disease, unspecified: Secondary | ICD-10-CM

## 2022-03-12 NOTE — Telephone Encounter (Signed)
Team I ordered this MRI-please make sure this is completed within 2 weeks ideally-my understanding is to not order as stat on an outpatient basis for MRI but I can certainly change the priority if needed

## 2022-03-12 NOTE — Telephone Encounter (Signed)
I have called the patient with the results of his low dose CT Chest. I explained that from a lung cancer perspective, his scan was read as a Lung RADS 2: nodules that are benign in appearance and behavior with a very low likelihood of becoming a clinically active cancer due to size or lack of growth. Recommendation per radiology is for a repeat LDCT in 12 months.   There was also notation of  New irregular liver lesions measuring 4.7 x 3.6 cm in the right lobe on image 58 and 3.0 x 2.3 cm on image 62 in the central liver.I explained that this was not present on last years scan, and that radiology are recommending a contrast CT/ MRI of the abdomen and pelvis for further evaluation. I have sent a staff message to Dr. Yong Channel, and I have included him on this message  so he is aware I have called the patient and recommendation is for further imaging.   Dr. Yong Channel, please let me know if you have any questions or concerns.   Denise, please place order for 12 month low dose Ct Chest for follow up.  Thanks so much

## 2022-03-15 ENCOUNTER — Other Ambulatory Visit: Payer: Self-pay

## 2022-03-15 DIAGNOSIS — Z122 Encounter for screening for malignant neoplasm of respiratory organs: Secondary | ICD-10-CM

## 2022-03-15 DIAGNOSIS — Z87891 Personal history of nicotine dependence: Secondary | ICD-10-CM

## 2022-03-15 NOTE — Telephone Encounter (Signed)
Called and spoke with pt and the number provided to [facility] to schedule his MRI.

## 2022-03-15 NOTE — Telephone Encounter (Signed)
Order placed for annual LDCT

## 2022-03-17 DIAGNOSIS — M5442 Lumbago with sciatica, left side: Secondary | ICD-10-CM | POA: Diagnosis not present

## 2022-03-17 DIAGNOSIS — M9903 Segmental and somatic dysfunction of lumbar region: Secondary | ICD-10-CM | POA: Diagnosis not present

## 2022-03-24 DIAGNOSIS — M5442 Lumbago with sciatica, left side: Secondary | ICD-10-CM | POA: Diagnosis not present

## 2022-03-24 DIAGNOSIS — M9903 Segmental and somatic dysfunction of lumbar region: Secondary | ICD-10-CM | POA: Diagnosis not present

## 2022-03-31 DIAGNOSIS — M5442 Lumbago with sciatica, left side: Secondary | ICD-10-CM | POA: Diagnosis not present

## 2022-03-31 DIAGNOSIS — M9903 Segmental and somatic dysfunction of lumbar region: Secondary | ICD-10-CM | POA: Diagnosis not present

## 2022-04-06 DIAGNOSIS — M9903 Segmental and somatic dysfunction of lumbar region: Secondary | ICD-10-CM | POA: Diagnosis not present

## 2022-04-06 DIAGNOSIS — M5442 Lumbago with sciatica, left side: Secondary | ICD-10-CM | POA: Diagnosis not present

## 2022-04-20 ENCOUNTER — Ambulatory Visit
Admission: RE | Admit: 2022-04-20 | Discharge: 2022-04-20 | Disposition: A | Discharge status: Home / Self Care | Payer: 59 | Source: Ambulatory Visit | Attending: Family Medicine | Admitting: Family Medicine

## 2022-04-20 ENCOUNTER — Other Ambulatory Visit: Payer: 59

## 2022-04-20 DIAGNOSIS — D1803 Hemangioma of intra-abdominal structures: Secondary | ICD-10-CM | POA: Diagnosis not present

## 2022-04-20 DIAGNOSIS — K760 Fatty (change of) liver, not elsewhere classified: Secondary | ICD-10-CM | POA: Diagnosis not present

## 2022-04-20 DIAGNOSIS — K769 Liver disease, unspecified: Secondary | ICD-10-CM

## 2022-04-20 MED ORDER — GADOPICLENOL 0.5 MMOL/ML IV SOLN
9.0000 mL | Freq: Once | INTRAVENOUS | Status: AC | PRN
  Administered 2022-04-20: 9 mL via INTRAVENOUS

## 2022-04-27 ENCOUNTER — Other Ambulatory Visit: Payer: Self-pay

## 2022-05-18 ENCOUNTER — Other Ambulatory Visit: Payer: Self-pay | Admitting: Family Medicine

## 2022-05-19 ENCOUNTER — Other Ambulatory Visit (HOSPITAL_COMMUNITY): Payer: Self-pay

## 2022-05-19 MED ORDER — OMEPRAZOLE 20 MG PO CPDR
20.0000 mg | DELAYED_RELEASE_CAPSULE | Freq: Every day | ORAL | 3 refills | Status: DC
  Filled 2022-05-19: qty 90, 90d supply, fill #0

## 2022-05-28 ENCOUNTER — Encounter: Payer: Self-pay | Admitting: Family Medicine

## 2022-05-28 ENCOUNTER — Ambulatory Visit (INDEPENDENT_AMBULATORY_CARE_PROVIDER_SITE_OTHER): Payer: Self-pay | Admitting: Family Medicine

## 2022-05-28 ENCOUNTER — Other Ambulatory Visit (HOSPITAL_COMMUNITY): Payer: Self-pay

## 2022-05-28 VITALS — BP 124/78 | HR 68 | Temp 98.2°F | Ht 70.0 in | Wt 168.0 lb

## 2022-05-28 DIAGNOSIS — D692 Other nonthrombocytopenic purpura: Secondary | ICD-10-CM

## 2022-05-28 DIAGNOSIS — I700 Atherosclerosis of aorta: Secondary | ICD-10-CM

## 2022-05-28 DIAGNOSIS — K760 Fatty (change of) liver, not elsewhere classified: Secondary | ICD-10-CM

## 2022-05-28 DIAGNOSIS — I10 Essential (primary) hypertension: Secondary | ICD-10-CM

## 2022-05-28 DIAGNOSIS — E785 Hyperlipidemia, unspecified: Secondary | ICD-10-CM

## 2022-05-28 DIAGNOSIS — J439 Emphysema, unspecified: Secondary | ICD-10-CM

## 2022-05-28 LAB — COMPREHENSIVE METABOLIC PANEL
ALT: 60 U/L — ABNORMAL HIGH (ref 0–53)
AST: 54 U/L — ABNORMAL HIGH (ref 0–37)
Albumin: 4.6 g/dL (ref 3.5–5.2)
Alkaline Phosphatase: 46 U/L (ref 39–117)
BUN: 13 mg/dL (ref 6–23)
CO2: 26 mEq/L (ref 19–32)
Calcium: 9.4 mg/dL (ref 8.4–10.5)
Chloride: 101 mEq/L (ref 96–112)
Creatinine, Ser: 0.76 mg/dL (ref 0.40–1.50)
GFR: 93.42 mL/min (ref >60.00)
Glucose, Bld: 76 mg/dL (ref 70–99)
Potassium: 4.1 mEq/L (ref 3.5–5.1)
Sodium: 139 mEq/L (ref 135–145)
Total Bilirubin: 0.8 mg/dL (ref 0.2–1.2)
Total Protein: 7.6 g/dL (ref 6.0–8.3)

## 2022-05-28 MED ORDER — TRAZODONE HCL 50 MG PO TABS
25.0000 mg | ORAL_TABLET | Freq: Every evening | ORAL | 3 refills | Status: DC | PRN
  Filled 2022-05-28: qty 30, 30d supply, fill #0

## 2022-05-28 NOTE — Patient Instructions (Addendum)
-  Discussed dementia and fall risk  with alprazolam as he continues to age and discussed option of trazodone. If causes erection over 4 hours seek care and stop medicine!   -dont use the 2 together  Please stop by lab before you go If you have mychart- we will send your results within 3 business days of [place] receiving them.  If you do not have mychart- we will call you about results within 5 business days of [place] receiving them.  *please also note that you will see labs on mychart as soon as they post. I will later go in and write notes on them- will say "notes from Dr. Yong Channel"   Recommended follow up: Return in about 6 months (around 11/26/2022) for physical or sooner if needed.Schedule b4 you leave.

## 2022-05-28 NOTE — Progress Notes (Signed)
Phone [phone] In person visit   Subjective:   Angel Ellis is a 67 y.o. year old very pleasant male patient who presents for/with See problem oriented charting Chief Complaint  Patient presents with   Follow-up   Hyperlipidemia   Hypertension   Back Pain    Pt c/o slipped disk that occurred a few months ago at work.   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     BLACK ELDERBERRY PO Take by mouth.     Cyanocobalamin (B-12 PO) Take by mouth.     MILK THISTLE PO Take by mouth.     Multiple Vitamin (MULTIVITAMIN PO) Take by mouth.     Multiple Vitamins-Minerals (EYE VITAMINS PO) Take by mouth.     nebivolol (BYSTOLIC) 5 MG tablet Take 1 tablet (5 mg total) by mouth daily. 90 tablet 3   omeprazole (PRILOSEC) 20 MG capsule Take 1 capsule (20 mg total) by mouth daily. 90 capsule 3   rosuvastatin (CRESTOR) 20 MG tablet Take 1 tablet (20 mg total) by mouth 2 (two) times a week. 26 tablet 3   saw palmetto 500  MG capsule Take 500 mg by mouth daily.     traZODone (DESYREL) 50 MG tablet Take 1/2-1 tablet (25-50 mg total) by mouth at bedtime as needed for sleep. (In place of alprazolam) 30 tablet 3   VITAMIN E PO Take by mouth.     ALPRAZolam (XANAX) 0.5 MG tablet Take 1 tablet (0.5 mg total) by mouth at bedtime as needed for anxiety (do not drive for 8 hours after taking). (Patient not taking: Reported on 05/28/2022) 30 tablet 2   No current facility-administered medications for this visit.     Objective:  BP 124/78   Pulse 68   Temp 98.2 F (36.8 C)   Ht '5\' 10"'$  (1.778 m)   Wt 168 lb (76.2 kg)   SpO2 98%   BMI 24.11 kg/m  Gen: NAD, resting comfortably CV: RRR no murmurs rubs or gallops Lungs: CTAB no crackles, wheeze, rhonchi Ext: no edema Skin: warm, dry    Assessment and Plan   #social update- lost brother to UTI recently- states didn't take good care of himself -considering options for work/cutting back  # Back pain with sciatica into left leg S: Patient is concerned he  may have a slipped disc that started with work-related activity (was stuck between wall and bed and leaned forward at the waist to get cords and felt a slip sensation)-he reports pain in left low back that radiates into the left lateral side of his leg all the way down to ankle- but hurts the worst below the knee. Worse in the AM getting out of bed. Wearing lumbar belt at work with some help. Driving interestingly helps with the belt on and eventually goes away in day.  Worst 8/10 first thing in morning leaning over to brush teeth.   Improves with activity- within an hour of walking at work actually resolves.   Saw Dr. Tamala Julian and treated with OMT with some improvement. Then saw chiropractor and had x-ray which showed a slipped disc- had 12 visits with chiropractor- slowly improving A/P: Symptoms are improving with chiropractor-he wants to continue with him- thankfully making progress    #hypertension S: medication:  Nebivolol 5 mg BP Readings from Last 3 Encounters:  05/28/22 124/78  01/28/22 (!) 140/90  11/26/21 110/64   A/P: Controlled. Continue current medications.  #hyperlipidemia #aortic atherosclerosis #fatty liver S: Medication:rosuvastatin 20 mg twice a week  -he has been trying to improve the diet - cutting down on simple carbs and processed foods. Perhaps 2 alcoholic beverages a day but knows less would be healthier for the liver.  Lab Results  Component Value Date   CHOL 189 11/26/2021   HDL 92.80 11/26/2021   LDLCALC 85 11/26/2021   LDLDIRECT 70.0 11/13/2020   TRIG 57.0 11/26/2021   CHOLHDL 2 11/26/2021   A/P: Reasonable control on last check-ideal LDL would be under 70 but he has been able to reach this with current medications in the past we will continue to work on diet and exercise Aortic atherosclerosis (presumed stable)- LDL goal ideally <70-continue current medication  -Of note patient also has fatty liver as incidental finding so healthy eating/or exercise will be beneficial for this as well. We want to avoid increasing statin in light of fatty liver as well.   #Gout- no recent issues  # GERD S:Medication:  omeprazole 20 mg -failed pepcid trial 2017 and 2023 A/P: stable-  but he is worried about long term side effects- he wants to trial pepcid/famotidine again twice daily before meals and I think that's reasonable  # B12 deficiency S: Current treatment/medication (oral vs. IM): Cyanocobalamin 1000 mcg   Lab Results  Component Value Date   VITAMINB12 1,200 (H) 11/26/2021   A/P: Adequately repleted-continue current medication  #emphysema-incidental finding-remains asymptomatic-continue to monitor without medicine  #Insomnia-controlled with alprazolam as needed maybe Saturday or Sunday night only- not needing in the week  -Discussed dementia and fall risk  with alprazolam as he continues to age and discussed option of trazodone. If causes erection over 4 hours seek care  and stop medicine!    #senile purpura- noted. Stable. Check cbc at least annually  Recommended follow up: Return in about 6 months (around 11/26/2022) for physical or sooner if needed.Schedule b4 you leave.  Lab/Order associations:   ICD-10-CM   1. Essential hypertension  I10 Comprehensive metabolic panel    2. Hyperlipidemia, unspecified hyperlipidemia type  E78.5 Comprehensive metabolic panel    3. Aortic atherosclerosis (HCC)  I70.0     4. Pulmonary emphysema, unspecified emphysema type ([place])  J43.9     5. Fatty liver  K76.0 Comprehensive metabolic panel    6. Senile purpura (HCC) Chronic D69.2  Meds ordered this encounter  Medications   traZODone (DESYREL) 50 MG tablet    Sig: Take 1/2-1 tablet (25-50 mg total) by mouth at bedtime as needed for sleep. (In place of alprazolam)    Dispense:  30 tablet    Refill:  3    Return precautions advised.  Garret Reddish, MD

## 2022-06-07 ENCOUNTER — Other Ambulatory Visit (HOSPITAL_COMMUNITY): Payer: Self-pay

## 2022-06-07 ENCOUNTER — Other Ambulatory Visit: Payer: Self-pay | Admitting: Family Medicine

## 2022-06-07 MED ORDER — ROSUVASTATIN CALCIUM 20 MG PO TABS
20.0000 mg | ORAL_TABLET | ORAL | 3 refills | Status: DC
  Filled 2022-06-07: qty 8, 28d supply, fill #0
  Filled 2022-07-22: qty 8, 28d supply, fill #1

## 2022-07-12 ENCOUNTER — Ambulatory Visit (HOSPITAL_COMMUNITY)
Admission: EM | Admit: 2022-07-12 | Discharge: 2022-07-12 | Disposition: A | Discharge status: Home / Self Care | Payer: Medicare Other | Attending: Internal Medicine | Admitting: Internal Medicine

## 2022-07-12 ENCOUNTER — Other Ambulatory Visit (HOSPITAL_COMMUNITY): Payer: Self-pay

## 2022-07-12 ENCOUNTER — Encounter (HOSPITAL_COMMUNITY): Payer: Self-pay

## 2022-07-12 DIAGNOSIS — J0110 Acute frontal sinusitis, unspecified: Secondary | ICD-10-CM | POA: Diagnosis not present

## 2022-07-12 DIAGNOSIS — J3489 Other specified disorders of nose and nasal sinuses: Secondary | ICD-10-CM | POA: Diagnosis not present

## 2022-07-12 IMAGING — DX DG FOOT COMPLETE 3+V*R*
3 series · 3 of 3 positions shown · non-contrast
Comparison: None Available.

CLINICAL DATA: Right foot injury.

EXAM:
RIGHT FOOT COMPLETE - 3+ VIEW

[foot ap]
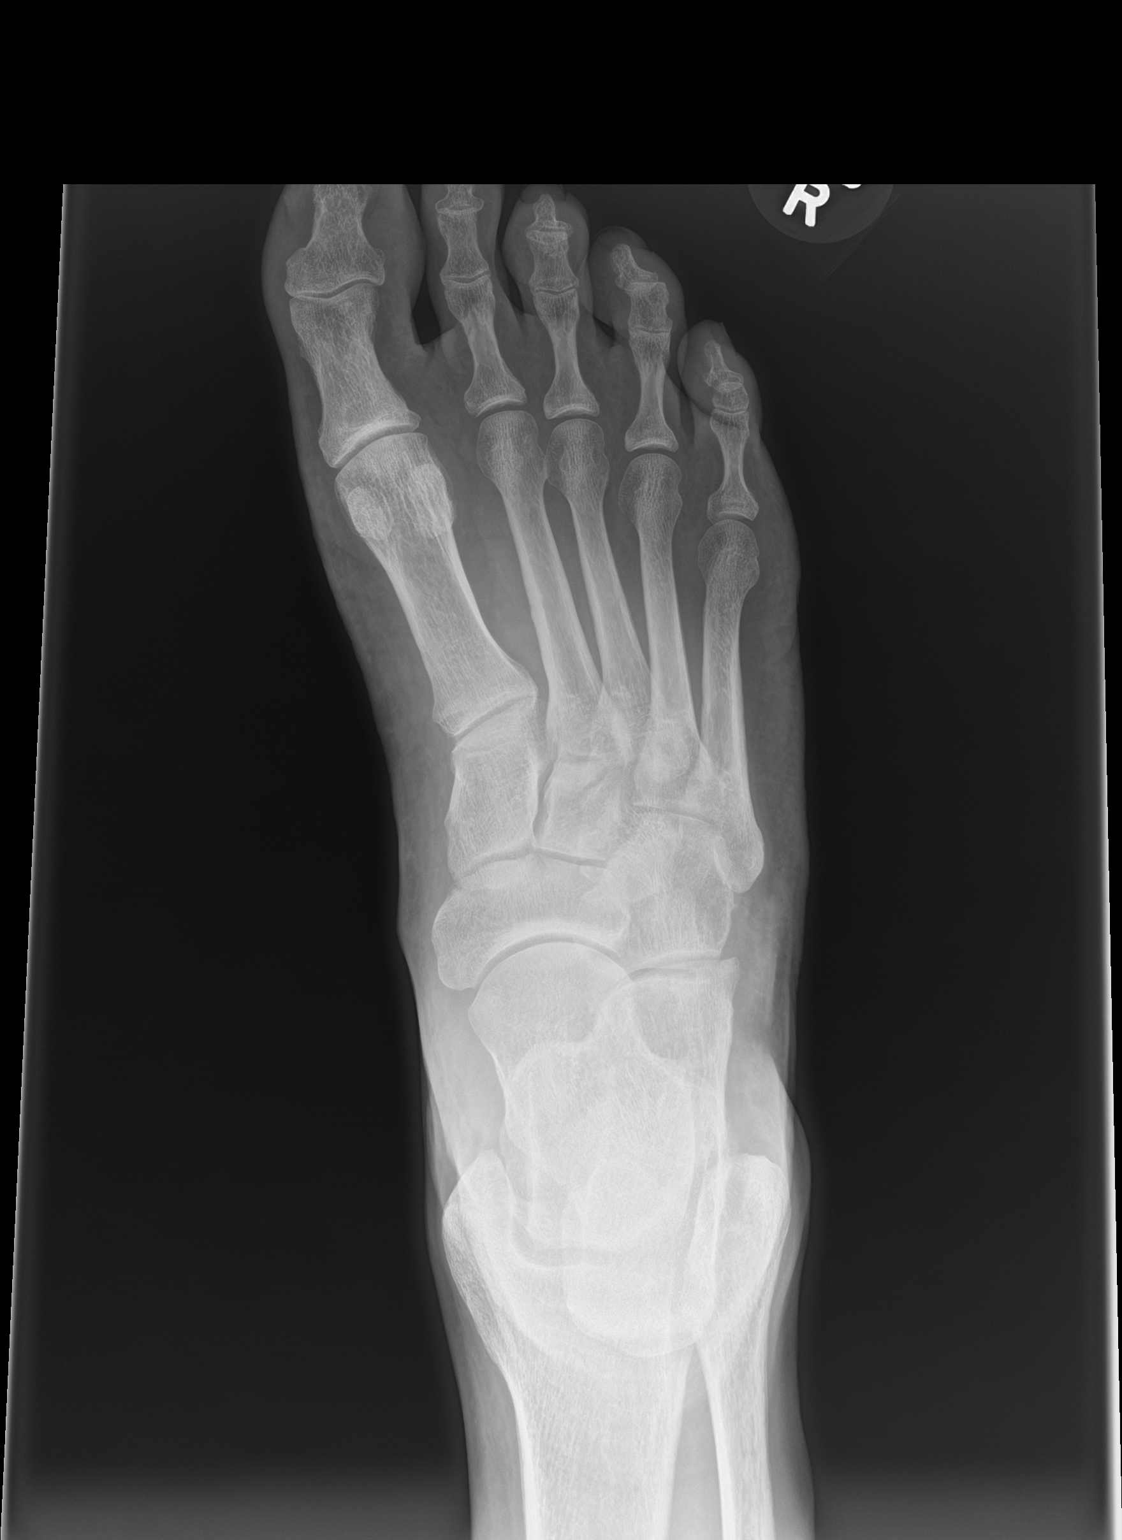

[foot obl]
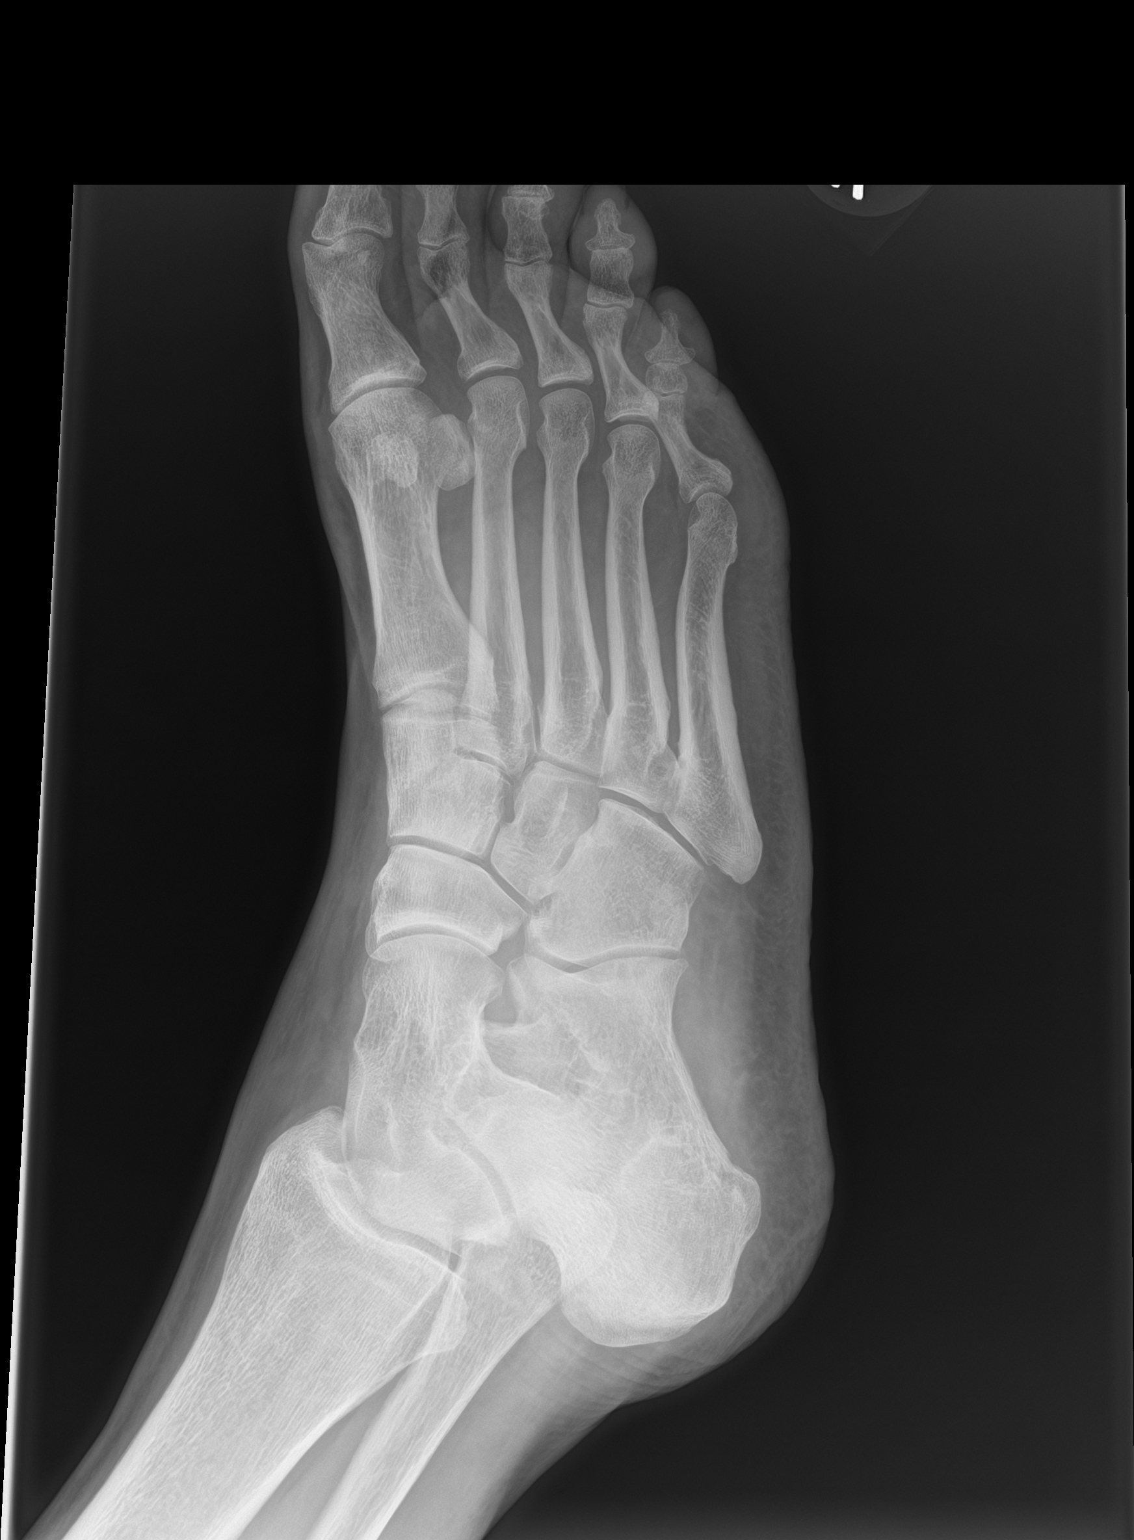

[foot lat]
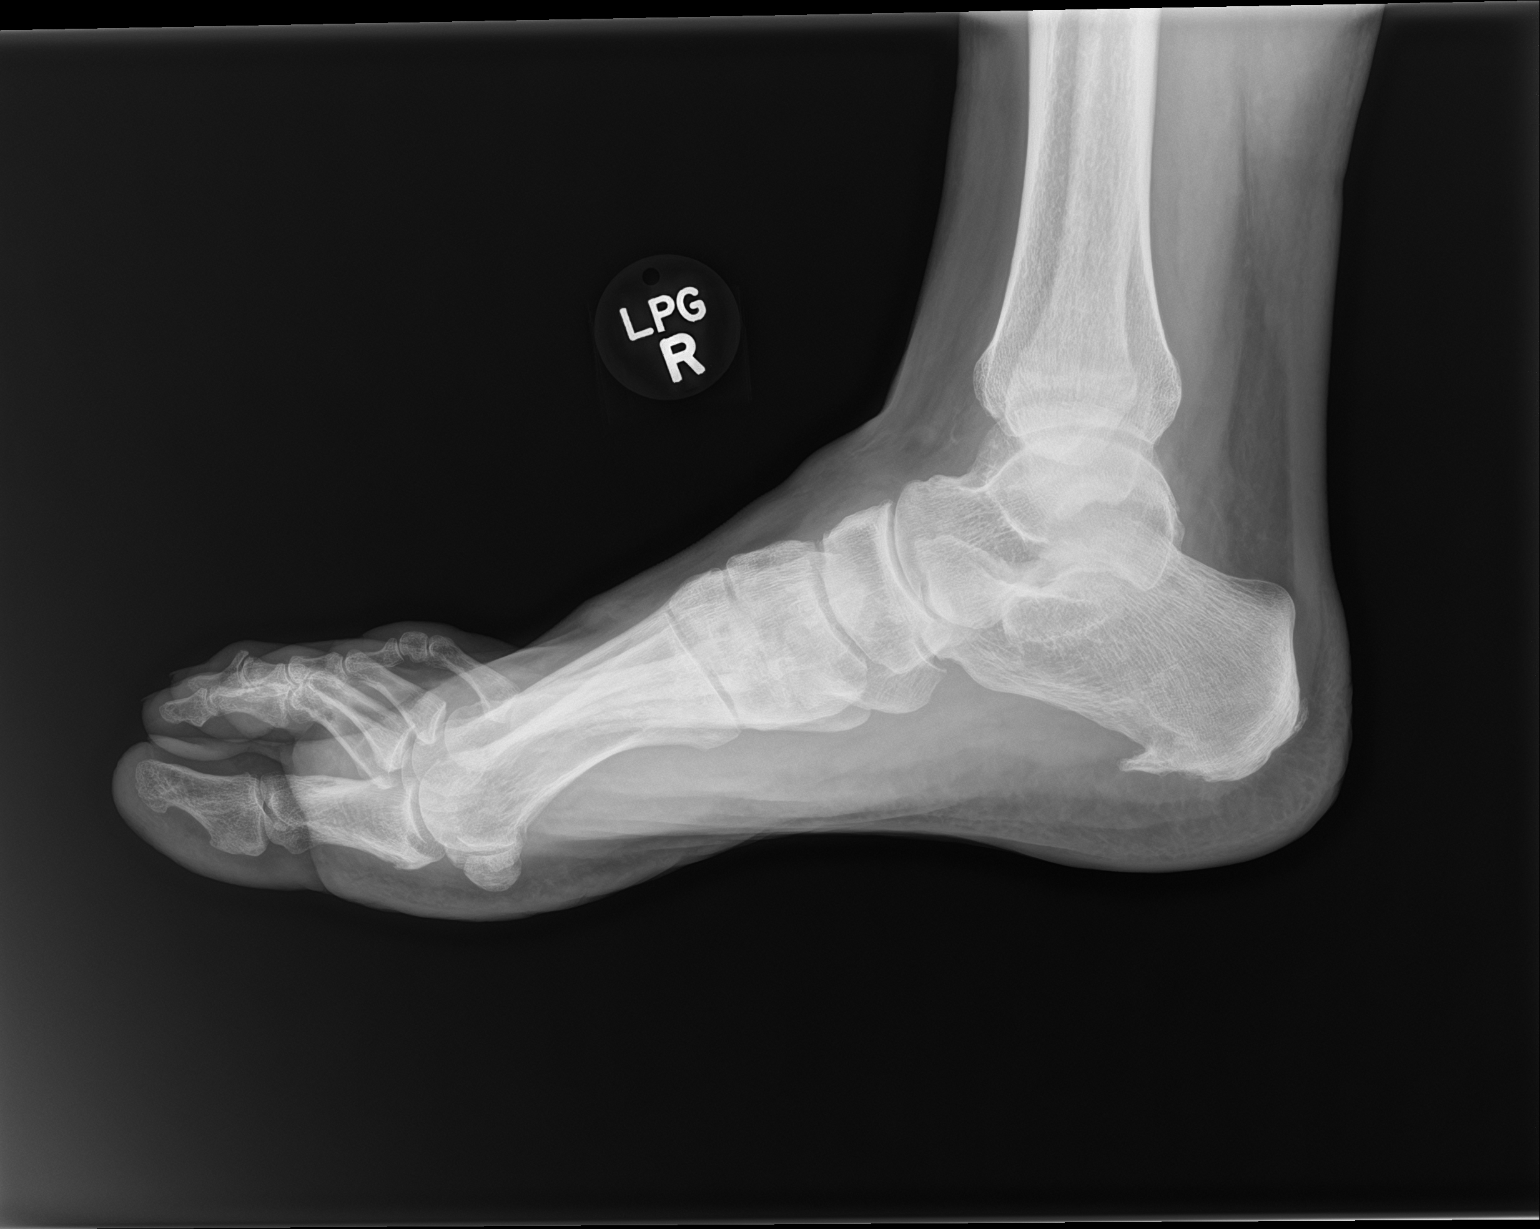

[3 of 3 positions shown; findings below may reference images not displayed]

FINDINGS: There is no evidence of fracture or dislocation. There is no
evidence of arthropathy or other focal bone abnormality. Soft
tissues are unremarkable.
IMPRESSION: Negative.

## 2022-07-12 MED ORDER — TRIAMCINOLONE ACETONIDE 40 MG/ML IJ SUSP
40.0000 mg | Freq: Once | INTRAMUSCULAR | Status: AC
  Administered 2022-07-12: 40 mg via INTRAMUSCULAR

## 2022-07-12 MED ORDER — TRIAMCINOLONE ACETONIDE 40 MG/ML IJ SUSP
INTRAMUSCULAR | Status: AC
  Filled 2022-07-12: qty 1

## 2022-07-12 MED ORDER — CLINDAMYCIN HCL 300 MG PO CAPS
300.0000 mg | ORAL_CAPSULE | Freq: Three times a day (TID) | ORAL | 0 refills | Status: AC
  Filled 2022-07-12: qty 15, 5d supply, fill #0

## 2022-07-12 NOTE — ED Triage Notes (Signed)
Pt is here for facial sinus pressure x 6days

## 2022-07-12 NOTE — Discharge Instructions (Signed)
You were given a steroid injection today in office (Kenalog) for sinus pressure and congestion. Recommend OTC Nasonex as needed. Clindamycin was sent to your pharmacy if no relief. Take as directed. Return in 2 to 3 days if no improvement.

## 2022-07-12 NOTE — ED Provider Notes (Signed)
[place]    CSN: [id number] Arrival date & time: 07/12/22  [id number]      History   Chief Complaint Chief Complaint  Patient presents with   Facial Pain    HPI Angel Ellis is a 67 y.o. male presenting for 6 days of R frontal pain. Reports pain has been getting progressively worse. Pain improves with palpation and leaning forward. Denies fever, chills, cough, rhinorrhea. No known sick contacts, but does work in [employer]. Patient had crown placed on the same side of his face about 3 months ago. Presents in NAD.    Past Medical History:  Diagnosis Date   Allergy    seasonal allergies   Diverticulitis of colon 10/29/2009   After august 1995     Diverticulosis    Emphysema of lung ([place])    not on any meds-past smoker   GERD (gastroesophageal reflux disease)    on meds   Gout    Hypertension    Lipoma of neck    L neck-remains in place   Nasal congestion    Sebaceous cyst 01/07/2009   R axilla      Patient Active Problem List   Diagnosis Date Noted   Low back pain 01/28/2022   Somatic dysfunction of spine, lumbar 01/28/2022   B12 deficiency 05/12/2020   Emphysema of lung ([place]) 11/08/2019   Fatty liver 11/08/2019   Senile purpura ([place]) 11/08/2019   Aortic atherosclerosis ([place]) 11/07/2018   Insomnia 08/23/2017   Gout 07/23/2014   Allergic rhinitis 07/23/2014   Essential hypertension 07/23/2014   Rosacea 07/23/2014   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 Authorizing Provider  clindamycin (CLEOCIN) 300 MG capsule Take 1 capsule (300 mg total) by mouth every 8 (eight) hours for 5 days. 07/12/22 07/17/22 Yes Veona Bittman,  Amardeep Beckers P, PA-C  Cyanocobalamin (B-12 PO) Take by mouth.   Yes [provider]  MILK THISTLE PO Take by mouth.   Yes [provider]  Multiple Vitamin (MULTIVITAMIN PO) Take by mouth.   Yes [provider]  Multiple Vitamins-Minerals (EYE VITAMINS PO) Take by mouth.   Yes [provider]  nebivolol (BYSTOLIC) 5 MG tablet Take 1 tablet (5 mg total) by mouth daily. 05/20/21  Yes Marin Olp, MD  rosuvastatin (CRESTOR) 20 MG tablet Take 1 tablet (20 mg total) by mouth 2 (two) times a week. 06/07/22  Yes Marin Olp, MD  saw palmetto 500 MG capsule Take 500 mg by mouth daily.   Yes [provider]  traZODone (DESYREL) 50 MG tablet Take 1/2-1 tablet (25-50 mg total) by mouth at bedtime as needed for sleep. (In place of alprazolam) 05/28/22  Yes Marin Olp, MD  VITAMIN E PO Take by mouth.   Yes [provider]  ALPRAZolam Duanne Moron) 0.5 MG tablet Take 1 tablet (0.5 mg total) by mouth at bedtime as needed for anxiety (do not drive for 8 hours after taking). Patient not taking: Reported on 05/28/2022 02/04/22   Marin Olp, MD  BIOTIN PO Take by mouth.    [provider]  BLACK ELDERBERRY PO Take by mouth.    [provider]  omeprazole (PRILOSEC) 20 MG capsule Take 1 capsule (20 mg total) by mouth daily. 05/19/22   Marin Olp, MD  fluticasone (FLONASE) 50 MCG/ACT nasal spray Place 2 sprays into both nostrils daily. Patient not taking: Reported on 12/28/2019 11/07/18 03/12/20  Marin Olp, MD    Family History Family History  Problem Relation Age of Onset   Heart disease Mother        stopped psychiatric meds and BP meds, later with MI   Hypertension Mother    Schizophrenia Mother    Heart disease Father        sleep apnea not controlled. 51.    Urinary tract infection Brother        only brother. died at 50 related to complications. states didnt care for himself   Colon polyps Neg Hx    Colon  cancer Neg Hx    Esophageal cancer Neg Hx    Rectal cancer Neg Hx    Stomach cancer Neg Hx     Social History Social History   Tobacco Use   Smoking status: Former    Packs/day: 1.00    Years: 37.00    Total pack years: 37.00    Types: Cigarettes    Quit date: 2011    Years since quitting: 13.1   Smokeless tobacco: Never  Vaping Use   Vaping Use: Never used  Substance Use Topics   Alcohol use: Yes    Alcohol/week: 21.0 standard drinks of alcohol    Types: 21 Standard drinks or equivalent per week    Comment: one per day   Drug use: No     Allergies   Aspirin, Milk-related compounds, and Penicillins   Review of Systems Review of Systems  Constitutional:  Negative for chills and fever.  HENT:  Positive for congestion, sinus pressure and sinus pain. Negative for ear pain, facial swelling, rhinorrhea, sneezing and sore throat.   Respiratory:  Negative for cough.   Neurological:  Negative for headaches.     Physical Exam Triage Vital Signs ED Triage Vitals  Enc Vitals Group     BP 07/12/22 0833 127/67     Pulse Rate 07/12/22 0833 61     Resp 07/12/22 0833 12     Temp 07/12/22 0833 98.2 F (36.8 C)     Temp Source 07/12/22 0833 Oral     SpO2 07/12/22 0833 98 %     Weight --      Height --      Head Circumference --      Peak Flow --      Pain Score 07/12/22 0827 3     Pain Loc --      Pain Edu? --      Excl. in [place]? --    No data found.  Updated Vital Signs BP 127/67 (BP Location: Left Arm)   Pulse 61   Temp 98.2 F (36.8 C) (Oral)   Resp 12   SpO2 98%    Physical Exam Constitutional:      General: He is not in acute distress.    Appearance: Normal appearance. He is normal weight. He is not ill-appearing or toxic-appearing.  HENT:     Head: Normocephalic and atraumatic.     Right Ear: Tympanic membrane and ear canal normal.     Left Ear: Tympanic membrane and ear canal normal.     Nose:  Right Sinus: Frontal sinus tenderness present.      Mouth/Throat:     Dentition: Normal dentition. No gingival swelling.     Pharynx: Oropharynx is clear. Uvula midline. No posterior oropharyngeal erythema.     Tonsils: No tonsillar exudate or tonsillar abscesses.  Cardiovascular:     Rate and Rhythm: Normal rate and regular rhythm.     Heart sounds: Normal heart sounds.  Pulmonary:     Effort: Pulmonary effort is normal.     Breath sounds: Normal breath sounds.     Comments: Clear to auscultation bilaterally Skin:    General: Skin is warm and dry.  Neurological:     General: No focal deficit present.     Mental Status: He is alert.  Psychiatric:        Mood and Affect: Mood and affect normal.      UC Treatments / Results  Labs (all labs ordered are listed, but only abnormal results are displayed) Labs Reviewed - No data to display  EKG   Radiology No results found.  Procedures Procedures (including critical care time)  Medications Ordered in UC Medications  triamcinolone acetonide (KENALOG-40) injection 40 mg (has no administration in time range)    Initial Impression / Assessment and Plan / UC Course  I have reviewed the triage vital signs and the nursing notes.  Pertinent labs & imaging results that were available during my care of the patient were reviewed by me and considered in my medical decision making (see chart for details).  Acute Sinusitis: Afebrile, nontoxic-appearing, NAD. VSS.  Kenalog given for inflammation and symptomatic relief. Given length of symptoms clindamycin prescribed for bacterial coverage as well as any possible dental infection secondary to crown placement. Return in 2 to 3 days if no improvement. ED return precautions discussed. Patient verbalizes understanding and agreement.  DDX includes but not limited to: bacterial sinusitis versus viral sinusitis versus dental infection.  Final Clinical Impressions(s) / UC Diagnoses   Final diagnoses:  Acute non-recurrent frontal sinusitis   Sinus pressure     Discharge Instructions      You were given a steroid injection today in office (Kenalog) for sinus pressure and congestion. Recommend OTC Nasonex as needed. Clindamycin was sent to your pharmacy if no relief. Take as directed. Return in 2 to 3 days if no improvement.   ED Prescriptions     Medication Sig Dispense Auth. Provider   clindamycin (CLEOCIN) 300 MG capsule Take 1 capsule (300 mg total) by mouth every 8 (eight) hours for 5 days. 15 capsule Karita Dralle P, PA-C      PDMP not reviewed this encounter.   Carmie End, PA-C 07/12/22 0932

## 2022-07-22 ENCOUNTER — Other Ambulatory Visit: Payer: Self-pay

## 2022-07-22 ENCOUNTER — Other Ambulatory Visit (HOSPITAL_COMMUNITY): Payer: Self-pay

## 2022-07-22 ENCOUNTER — Other Ambulatory Visit: Payer: Self-pay | Admitting: Family Medicine

## 2022-07-22 MED ORDER — NEBIVOLOL HCL 5 MG PO TABS
5.0000 mg | ORAL_TABLET | Freq: Every day | ORAL | 3 refills | Status: DC
  Filled 2022-07-22: qty 90, 90d supply, fill #0

## 2022-07-23 ENCOUNTER — Other Ambulatory Visit (HOSPITAL_COMMUNITY): Payer: Self-pay

## 2022-08-31 ENCOUNTER — Encounter: Payer: Self-pay | Admitting: Family Medicine

## 2022-08-31 ENCOUNTER — Other Ambulatory Visit: Payer: Self-pay

## 2022-08-31 MED ORDER — TRAZODONE HCL 50 MG PO TABS: 25.0000 mg | ORAL_TABLET | Freq: Every evening | ORAL | 3 refills | Status: DC | PRN

## 2022-08-31 MED ORDER — NEBIVOLOL HCL 5 MG PO TABS: 5.0000 mg | ORAL_TABLET | Freq: Every day | ORAL | 3 refills | Status: DC

## 2022-08-31 MED ORDER — OMEPRAZOLE 20 MG PO CPDR: 20.0000 mg | DELAYED_RELEASE_CAPSULE | Freq: Every day | ORAL | 3 refills | Status: DC

## 2022-08-31 MED ORDER — ROSUVASTATIN CALCIUM 20 MG PO TABS: 20.0000 mg | ORAL_TABLET | ORAL | 3 refills | Status: DC

## 2022-09-15 ENCOUNTER — Telehealth: Payer: Self-pay | Admitting: Family Medicine

## 2022-09-15 MED ORDER — ALPRAZOLAM 0.5 MG PO TABS: 0.5000 mg | ORAL_TABLET | Freq: Every evening | ORAL | 2 refills | Status: AC | PRN

## 2022-09-15 NOTE — Telephone Encounter (Signed)
sent 

## 2022-09-15 NOTE — Telephone Encounter (Signed)
Prescription Request  09/15/2022  LOV: 05/28/2022  What is the name of the medication or equipment?  ALPRAZolam (XANAX) 0.5 MG tablet   Have you contacted your pharmacy to request a refill? Yes   Which pharmacy would you like this sent to?  [id number] Rosalita Levan,  - [address] AT [facility] [address] [place] [address] Phone: [phone] Fax: [phone]    Patient notified that their request is being sent to the clinical staff for review and that they should receive a response within 2 business days.   Please advise at Mobile [phone] (mobile)

## 2022-11-29 ENCOUNTER — Ambulatory Visit: Payer: Medicare Other | Admitting: Family Medicine

## 2022-11-29 ENCOUNTER — Encounter: Payer: Self-pay | Admitting: Family Medicine

## 2022-11-29 VITALS — BP 128/70 | HR 59 | Temp 98.4°F | Ht 70.0 in | Wt 168.8 lb

## 2022-11-29 DIAGNOSIS — Z0000 Encounter for general adult medical examination without abnormal findings: Secondary | ICD-10-CM | POA: Diagnosis not present

## 2022-11-29 DIAGNOSIS — I10 Essential (primary) hypertension: Secondary | ICD-10-CM

## 2022-11-29 DIAGNOSIS — E538 Deficiency of other specified B group vitamins: Secondary | ICD-10-CM

## 2022-11-29 DIAGNOSIS — Z136 Encounter for screening for cardiovascular disorders: Secondary | ICD-10-CM

## 2022-11-29 DIAGNOSIS — Z125 Encounter for screening for malignant neoplasm of prostate: Secondary | ICD-10-CM

## 2022-11-29 DIAGNOSIS — Z87891 Personal history of nicotine dependence: Secondary | ICD-10-CM | POA: Diagnosis not present

## 2022-11-29 DIAGNOSIS — E785 Hyperlipidemia, unspecified: Secondary | ICD-10-CM | POA: Diagnosis not present

## 2022-11-29 LAB — URINALYSIS, ROUTINE W REFLEX MICROSCOPIC
Bilirubin Urine: NEGATIVE
Hgb urine dipstick: NEGATIVE
Ketones, ur: NEGATIVE
Leukocytes,Ua: NEGATIVE
Nitrite: NEGATIVE
RBC / HPF: NONE SEEN (ref 0–?)
Specific Gravity, Urine: 1.02 (ref 1.000–1.030)
Total Protein, Urine: NEGATIVE
Urine Glucose: NEGATIVE
Urobilinogen, UA: 0.2 (ref 0.0–1.0)
pH: 6 (ref 5.0–8.0)

## 2022-11-29 LAB — COMPREHENSIVE METABOLIC PANEL
ALT: 57 U/L — ABNORMAL HIGH (ref 0–53)
AST: 49 U/L — ABNORMAL HIGH (ref 0–37)
Albumin: 4.5 g/dL (ref 3.5–5.2)
Alkaline Phosphatase: 47 U/L (ref 39–117)
BUN: 10 mg/dL (ref 6–23)
CO2: 27 mEq/L (ref 19–32)
Calcium: 9.6 mg/dL (ref 8.4–10.5)
Chloride: 101 mEq/L (ref 96–112)
Creatinine, Ser: 0.71 mg/dL (ref 0.40–1.50)
GFR: 95.02 mL/min (ref >60.00)
Glucose, Bld: 83 mg/dL (ref 70–99)
Potassium: 4.7 mEq/L (ref 3.5–5.1)
Sodium: 139 mEq/L (ref 135–145)
Total Bilirubin: 0.7 mg/dL (ref 0.2–1.2)
Total Protein: 7.6 g/dL (ref 6.0–8.3)

## 2022-11-29 LAB — LIPID PANEL
Cholesterol: 226 mg/dL — ABNORMAL HIGH (ref 0–200)
HDL: 101.3 mg/dL (ref >39.00)
LDL Cholesterol: 108 mg/dL — ABNORMAL HIGH (ref 0–99)
NonHDL: 124.62
Total CHOL/HDL Ratio: 2
Triglycerides: 84 mg/dL (ref 0.0–149.0)
VLDL: 16.8 mg/dL (ref 0.0–40.0)

## 2022-11-29 LAB — CBC WITH DIFFERENTIAL/PLATELET
Basophils Absolute: 0 10*3/uL (ref 0.0–0.1)
Basophils Relative: 0.2 % (ref 0.0–3.0)
Eosinophils Absolute: 0.4 10*3/uL (ref 0.0–0.7)
Eosinophils Relative: 7.6 % — ABNORMAL HIGH (ref 0.0–5.0)
HCT: 43.3 % (ref 39.0–52.0)
Hemoglobin: 14.2 g/dL (ref 13.0–17.0)
Lymphocytes Relative: 32.2 % (ref 12.0–46.0)
Lymphs Abs: 1.6 10*3/uL (ref 0.7–4.0)
MCHC: 32.9 g/dL (ref 30.0–36.0)
MCV: 95.1 fl (ref 78.0–100.0)
Monocytes Absolute: 0.5 10*3/uL (ref 0.1–1.0)
Monocytes Relative: 10.1 % (ref 3.0–12.0)
Neutro Abs: 2.5 10*3/uL (ref 1.4–7.7)
Neutrophils Relative %: 49.9 % (ref 43.0–77.0)
Platelets: 202 10*3/uL (ref 150.0–400.0)
RBC: 4.56 Mil/uL (ref 4.22–5.81)
RDW: 13.2 % (ref 11.5–15.5)
WBC: 5.1 10*3/uL (ref 4.0–10.5)

## 2022-11-29 LAB — VITAMIN B12: Vitamin B-12: >1501 pg/mL — ABNORMAL HIGH (ref 211–911)

## 2022-11-29 LAB — PSA, MEDICARE: PSA: 1.09 ng/ml (ref 0.10–4.00)

## 2022-11-29 NOTE — Patient Instructions (Addendum)
  Mr. Lemelin , Thank you for taking time to come for your Medicare Wellness Visit. I appreciate your ongoing commitment to your health goals. Please review the following plan we discussed and let me know if I can assist you in the future.   These are the goals we discussed: Reduce alcohol to max 2 a day but in long run try to eliminate to reduce risks to fatty liver Let us know when you decide to get your SHINGRIX/Pneumonia vaccines.  Please stop by lab before you go If you have mychart- we will send your results within 3 business days of [place] receiving them.  If you do not have mychart- we will call you about results within 5 business days of [place] receiving them.  *please also note that you will see labs on mychart as soon as they post. I will later go in and write notes on them- will say "notes from Dr. Durene Cal"    This is a list of the screening recommended for you and due dates:  Health Maintenance  Topic Date Due   Medicare Annual Wellness Visit  Never done   Zoster (Shingles) Vaccine (1 of 2) 03/01/2023*   Pneumonia Vaccine (1 of 2 - PCV) 11/29/2023*   Flu Shot  12/09/2022   Screening for Lung Cancer  03/09/2023   DTaP/Tdap/Td vaccine (4 - Td or Tdap) 07/07/2026   Colon Cancer Screening  01/29/2027   Hepatitis C Screening  Completed   HPV Vaccine  Aged Out   COVID-19 Vaccine  Discontinued  *Topic was postponed. The date shown is not the original due date.

## 2022-11-29 NOTE — Progress Notes (Signed)
Phone: [phone]   Subjective:  Patient presents today for their Welcome to Medicare Exam    Preventive Screening-Counseling & Management  Vision screen:  Vision Screening   Right eye Left eye Both eyes  Without correction     With correction 20/20 20/50 20/25    Advanced directives: domestic partner Aurther Loft is HCPOA- he has living will - would not want feeding support but is open  to full code for initial measures  Modifiable Risk Factors/behavioral risk assessment/psychosocial risk assessment Regular exercise: walking 10 miles a day on work days- encouraged to try  to find something on his off days especially in the long run when retires fully Diet: has tried to imprvoe this- cut out meat for most part. No chicken or [place] due to allergy/gout. Doing fish. Eating more vegetables- a lot of great changes-   Wt Readings from Last 3 Encounters:  11/29/22 168 lb 12.8 oz (76.6 kg)  05/28/22 168 lb (76.2 kg)  01/28/22 172 lb (78 kg)   Smoking Status: former Smoker- over 30 pack years. In lung cancer screening program but quit 16 years ago. Opts in for abdominal aortic aneurysm screen  Second Hand Smoking status: No smokers in home Alcohol intake: 14-21 per week- encouraged max 14 a week with 2 a day or less due to fatty liver Other substance abuse/illicit drugs: none  Cardiac risk factors:  advanced age (older than 83 for men, 80 for women)  treated Hyperlipidemia  treated Hypertension  Aortic atherosclerosis  No diabetes.  Family History: dad with heart disease in 12s- mother with heart disease in 82s- but thought related to psychiatric medications, father with uncontrolled sleep apnea and eventually with heart disease    Depression Screen/risk evaluation Risk factors: none other than loss of brother prior to last visit. PHQ9 0    11/29/2022    9:18 AM 11/26/2021    9:21 AM 11/13/2020    7:58 AM 05/12/2020    8:10 AM 11/08/2019    8:19 AM  Depression screen PHQ 2/9  Decreased  Interest 0 0 0 0 0  Down, Depressed, Hopeless 0 0 0 0 0  PHQ - 2 Score 0 0 0 0 0  Altered sleeping 0    0  Tired, decreased energy 0    0  Change in appetite 0    0  Feeling bad or failure about yourself  0    0  Trouble concentrating 0    0  Moving slowly or fidgety/restless 0    0  Suicidal thoughts 0    0  PHQ-9 Score 0    0  Difficult doing work/chores Not difficult at all    Not difficult at all    Functional ability and level of safety Mobility assessment:  timed get up and go <12 seconds Activities of Daily Living- Independent in ADLs (toileting, bathing, dressing, transferring, eating) and in IADLs (shopping, housekeeping, managing own medications, and handling finances) Home Safety: Loose rugs (a few mats but doesn't flip up and only x 2), smoke detectors (up to date ), small pets (none), grab bars (doesn't need yet), stairs (2 sets but no issues), life-alert system (would use cell phone) Hearing Difficulties: -patient declines Fall Risk: None     11/29/2022    9:17 AM 11/26/2021    9:21 AM 11/13/2020    7:58 AM 05/12/2020    8:11 AM 10/03/2015    8:34 AM  Fall Risk   Falls in the past year? 0 0  0 0 Yes  Number falls in past yr: 0 0 0  1  Injury with Fall? 0 0 0  No  Risk for fall due to : No Fall Risks No Fall Risks No Fall Risks No Fall Risks   Follow up Falls evaluation completed Education provided Falls evaluation completed    Opioid use history:  no long term opioids use Self assessment of health status: Between fair and excellent  Required Immunizations needed today:  offered prevnar 20 again, shingrix recommended at pharmacy as well as COVID shot (once new released but he opts out) Immunization History  Administered Date(s) Administered   Influenza,inj,Quad PF,6+ Mos 01/08/2013   Influenza-Unspecified 02/23/2015, 02/08/2020   PFIZER(Purple Top)SARS-COV-2 Vaccination 05/02/2019, 05/23/2019   Td 08/08/2005, 07/07/2016   Tdap 10/03/2015   Screening tests-  Colon  cancer screening- history of tubular adenoma-01/29/2020 with 7 year repeat planned  Lung Cancer screening-  enrolled and does annually- scheduled October- looks like still covered even though still at 16th year Skin cancer screening-  saw Dr. Yetta Barre for rosacea in past but no regular screening- none at present Prostate cancer screening- low risk PSA trend- check annually through 70 at least. He feels saw palmetto helpful   Lab Results  Component Value Date   PSA 0.93 11/26/2021   PSA 0.79 11/13/2020   PSA 0.78 11/08/2019   The following were reviewed and entered/updated in epic: Past Medical History:  Diagnosis Date   Allergy    seasonal allergies   Diverticulitis of colon 10/29/2009   After august 1995     Diverticulosis    Emphysema of lung (HCC)    not on any meds-past smoker   GERD (gastroesophageal reflux disease)    on meds   Gout    Hypertension    Lipoma of neck    L neck-remains in place   Nasal congestion    Sebaceous cyst 01/07/2009   R axilla     Patient Active Problem List   Diagnosis Date Noted   B12 deficiency 05/12/2020    Priority: Medium    Emphysema of lung (HCC) 11/08/2019    Priority: Medium    Aortic atherosclerosis (HCC) 11/07/2018    Priority: Medium    Insomnia 08/23/2017    Priority: Medium    Gout 07/23/2014    Priority: Medium    Essential hypertension 07/23/2014    Priority: Medium    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   Somatic dysfunction of spine, lumbar 01/28/2022    Priority: 1.   Past Surgical History:  Procedure Laterality Date   COLONOSCOPY  2010    TICS/hems   WISDOM TOOTH EXTRACTION  1980    Family History  Problem Relation Age of Onset   Heart disease Mother        stopped psychiatric meds and BP meds, later with MI at 12   Hypertension Mother    Schizophrenia Mother    Heart disease Father  sleep apnea not controlled and poor diet. 74.   Urinary tract infection Brother        only brother. died at 47 related to complications. states didnt care for himself   Heart attack Maternal Grandfather        died 93 but ate poorly- very fatty -  and prior smoker   Colon polyps Neg Hx    Colon cancer Neg Hx    Esophageal cancer Neg Hx    Rectal cancer Neg Hx    Stomach cancer Neg Hx     Medications- reviewed and updated Current Outpatient Medications  Medication Sig Dispense Refill   BIOTIN PO Take by mouth.     BLACK ELDERBERRY PO Take by mouth.     Cyanocobalamin (B-12 PO) Take by mouth.     MILK THISTLE PO Take by mouth.     Multiple Vitamin (MULTIVITAMIN PO) Take by mouth.     Multiple Vitamins-Minerals (EYE VITAMINS PO) Take by mouth.     nebivolol (BYSTOLIC) 5 MG tablet Take 1 tablet (5 mg total) by mouth daily. 90 tablet 3   rosuvastatin (CRESTOR) 20 MG tablet Take 1 tablet (20 mg total) by mouth 2 (two) times a week. 26 tablet 3   saw palmetto 500 MG capsule Take 500 mg by mouth daily.     VITAMIN E PO Take by mouth.     ALPRAZolam (XANAX) 0.5 MG tablet Take 1 tablet (0.5 mg total) by mouth at bedtime as needed for anxiety (do not drive for 8 hours after taking). (Patient not taking: Reported on 11/29/2022) 30 tablet 2   No current facility-administered medications for this visit.    Allergies-reviewed and updated Allergies  Allergen Reactions   Aspirin Other (See Comments)    Abdominal cramping, gi discomfort   Milk-Related Compounds     GI Symptoms   Penicillins Palpitations    Makes heart race    Social History   Socioeconomic History   Marital status: Single    Spouse name: Not on file   Number  of children: Not on file   Years of education: Not on file   Highest education level: Not on file  Occupational History   Not on file  Tobacco Use   Smoking status: Former    Current packs/day: 0.00    Average packs/day: 1 pack/day for 37.0 years (37.0 ttl pk-yrs)    Types: Cigarettes    Start date: 78    Quit date: 2011    Years since quitting: 13.5   Smokeless tobacco: Never  Vaping Use   Vaping status: Never Used  Substance and Sexual Activity   Alcohol use: Yes    Alcohol/week: 21.0 standard drinks of alcohol    Types: 21 Standard drinks or equivalent per week    Comment: one per day   Drug use: No   Sexual activity: Never  Other Topics Concern   Not on file  Social History Narrative   Domestic partner, not sexually active Aurther Loft [employer]dermil[employer] also a patient here). No kids.       Radiology transporter at [employer]      Hobbies: genealogy, cooking, works on clocks.    Social Determinants of Health   Financial Resource Strain: None/declines  Food Insecurity: None/declines  Transportation Needs: None/declines  Physical Activity: excellent- walks 10 miles about 3 days a week with work  Stress: None/declines- manages any issues well  Social Connections: good support/community    Objective  Objective:  BP 128/70  Pulse (!) 59   Temp 98.4 F (36.9 C)   Ht 5\' 10"  (1.778 m)   Wt 168 lb 12.8 oz (76.6 kg)   SpO2 98%   BMI 24.22 kg/m  Gen: NAD, resting comfortably HEENT: Mucous membranes are moist. Oropharynx normal Neck: no thyromegaly CV: RRR no murmurs rubs or gallops Lungs: CTAB no crackles, rhonchi. Occasional mild wheeze Abdomen: soft/nontender/nondistended/normal bowel sounds. No rebound or guarding.  Ext: no edema Skin: warm, dry Neuro: grossly normal, moves all extremities, PERRLA   Assessment and Plan:   Welcome to Medicare exam completed-  Educated, counseled and referred based on above elements Educated, counseled and referred as appropriate for  preventative needs Discussed and documented a written plan for preventiative services and screenings with personalized health advice- After Visit Summary was given to patient which included this plan  4. EKG offered [id number]- patient reports had one recently and he will bring to visit to scan in- had too much caffeine at work and felt jittery- PVC noted- long term issue  Status of chronic or acute concerns   #hypertension S: medication: Nebivolol 5 mg BP Readings from Last 3 Encounters:  11/29/22 128/70  07/12/22 127/67  05/28/22 124/78  A/P: doing well overall- continue current medications- hasn't even taken yet today and blood pressure still ok  #hyperlipidemia #aortic atherosclerosis #fatty liver- on mr liver 04/20/22 S: Medication:rosuvastatin 20 mg twice a week- had been out and taking for last 3 weeks Lab Results  Component Value Date   CHOL 189 11/26/2021   HDL 92.80 11/26/2021   LDLCALC 85 11/26/2021   LDLDIRECT 70.0 11/13/2020   TRIG 57.0 11/26/2021   CHOLHDL 2 11/26/2021  A/P: lipids may be slightly high as only had medications in system 2-3 weeks - hopefully stable- update lipid panel today. Continue current meds for now  Aortic atherosclerosis (presumed stable)- LDL goal ideally <70 - slightly above goal last year- but unlikely to make changes today as has not been on full 6 weeks and with fatty liver want to be cautious - also takes hepagard OTC (available over the counter without a prescription) for liver health  #Gout S: no gout flares since 2 years. Just modified diet A/P:doing well without medications - continue to monitor    # GERD- doing well on Pepcid with sparing tums  # B12 deficiency S: Current treatment/medication (oral vs. IM): Cyanocobalamin 5000 mcg once a day Lab Results  Component Value Date   VITAMINB12 1,200 (H) 11/26/2021  A/P: hopefully stable- update b12 today. Continue current meds for now    #emphysema-incidental finding-no shortness  of breath - very active and does well   #Insomnia-controlled with alprazolam as needed- can refill if needed   Recommended follow up: 6 month follow up  Future Appointments  Date Time Provider Department Center  03/10/2023  8:00 AM [facility] [place]    Lab/Order associations:   ICD-10-CM   1. Preventative health care  Z00.00     2. Former smoker  Z87.891 [place] [employer] SCREENING    3. Hyperlipidemia, unspecified hyperlipidemia type  E78.5 Urinalysis, Routine w reflex microscopic    Comprehensive metabolic panel    CBC with Differential/Platelet    Lipid panel    4. Essential hypertension  I10 Urinalysis, Routine w reflex microscopic    5. B12 deficiency  E53.8 Vitamin B12    6. Screening for prostate cancer  Z12.5 PSA, Medicare    7. Screening for AAA (abdominal aortic aneurysm)  Z13.6  [facility] [employer] SCREENING     No orders of the defined types were placed in this encounter.   Return precautions advised. Tana Conch, MD

## 2022-12-06 ENCOUNTER — Ambulatory Visit
Admission: RE | Admit: 2022-12-06 | Discharge: 2022-12-06 | Disposition: A | Discharge status: Home / Self Care | Payer: Medicare Other | Source: Ambulatory Visit | Attending: Family Medicine | Admitting: Family Medicine

## 2022-12-06 DIAGNOSIS — Z87891 Personal history of nicotine dependence: Secondary | ICD-10-CM

## 2022-12-06 DIAGNOSIS — Z136 Encounter for screening for cardiovascular disorders: Secondary | ICD-10-CM

## 2023-03-10 ENCOUNTER — Ambulatory Visit (HOSPITAL_COMMUNITY)
Admission: RE | Admit: 2023-03-10 | Discharge: 2023-03-10 | Disposition: A | Discharge status: Home / Self Care | Payer: Medicare Other | Source: Ambulatory Visit | Attending: Acute Care | Admitting: Acute Care

## 2023-03-10 DIAGNOSIS — Z87891 Personal history of nicotine dependence: Secondary | ICD-10-CM | POA: Diagnosis present

## 2023-03-10 DIAGNOSIS — Z122 Encounter for screening for malignant neoplasm of respiratory organs: Secondary | ICD-10-CM | POA: Diagnosis present

## 2023-04-11 ENCOUNTER — Other Ambulatory Visit: Payer: Self-pay

## 2023-04-11 DIAGNOSIS — Z87891 Personal history of nicotine dependence: Secondary | ICD-10-CM

## 2023-04-11 DIAGNOSIS — Z122 Encounter for screening for malignant neoplasm of respiratory organs: Secondary | ICD-10-CM

## 2023-06-01 ENCOUNTER — Ambulatory Visit: Payer: Medicare Other | Admitting: Family Medicine

## 2023-06-15 ENCOUNTER — Encounter: Payer: Self-pay | Admitting: Family Medicine

## 2023-06-15 ENCOUNTER — Ambulatory Visit: Payer: Medicare Other | Admitting: Family Medicine

## 2023-06-15 VITALS — BP 130/62 | HR 65 | Temp 97.5°F | Ht 70.0 in | Wt 175.0 lb

## 2023-06-15 DIAGNOSIS — I700 Atherosclerosis of aorta: Secondary | ICD-10-CM | POA: Diagnosis not present

## 2023-06-15 DIAGNOSIS — J439 Emphysema, unspecified: Secondary | ICD-10-CM

## 2023-06-15 DIAGNOSIS — I10 Essential (primary) hypertension: Secondary | ICD-10-CM

## 2023-06-15 DIAGNOSIS — E785 Hyperlipidemia, unspecified: Secondary | ICD-10-CM

## 2023-06-15 LAB — COMPREHENSIVE METABOLIC PANEL
ALT: 41 U/L (ref 0–53)
AST: 37 U/L (ref 0–37)
Albumin: 4.4 g/dL (ref 3.5–5.2)
Alkaline Phosphatase: 44 U/L (ref 39–117)
BUN: 12 mg/dL (ref 6–23)
CO2: 27 meq/L (ref 19–32)
Calcium: 9.4 mg/dL (ref 8.4–10.5)
Chloride: 100 meq/L (ref 96–112)
Creatinine, Ser: 0.75 mg/dL (ref 0.40–1.50)
GFR: 93.11 mL/min (ref >60.00)
Glucose, Bld: 95 mg/dL (ref 70–99)
Potassium: 4.3 meq/L (ref 3.5–5.1)
Sodium: 137 meq/L (ref 135–145)
Total Bilirubin: 0.9 mg/dL (ref 0.2–1.2)
Total Protein: 7.5 g/dL (ref 6.0–8.3)

## 2023-06-15 LAB — LDL CHOLESTEROL, DIRECT: Direct LDL: 104 mg/dL

## 2023-06-15 MED ORDER — ROSUVASTATIN CALCIUM 20 MG PO TABS: ORAL_TABLET | ORAL | 3 refills | Status: AC

## 2023-06-15 NOTE — Progress Notes (Signed)
 Phone [phone] In person visit   Subjective:   Angel Ellis is a 68 y.o. year old very pleasant male patient who presents for/with See problem oriented charting Chief Complaint  Patient presents with   Medical Management of Chronic Issues   Hypertension   Hyperlipidemia   Gastroesophageal Reflux   Past Medical History-  Patient Active Problem List   Diagnosis Date Noted   B12 deficiency 05/12/2020    Priority: Medium    Emphysema of lung (HCC) 11/08/2019    Priority: Medium    Aortic atherosclerosis (HCC) 11/07/2018    Priority: Medium    Insomnia 08/23/2017    Priority: Medium    Gout 07/23/2014    Priority: Medium    Essential hypertension 07/23/2014    Priority: Medium    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   Somatic dysfunction of spine, lumbar 01/28/2022    Priority: 1.    Medications- reviewed and updated Current Outpatient Medications  Medication Sig Dispense Refill   BIOTIN PO Take by mouth.     BLACK ELDERBERRY PO Take by mouth.     Cyanocobalamin  (B-12 PO) Take by mouth.     famotidine (PEPCID) 20 MG tablet Take 20 mg by mouth 2 (two) times daily.     MILK THISTLE PO Take by mouth.     Multiple Vitamin (MULTIVITAMIN PO) Take by mouth.     Multiple Vitamins-Minerals (EYE VITAMINS PO) Take by mouth.     nebivolol  (BYSTOLIC ) 5 MG tablet Take 1 tablet (5 mg total) by mouth daily. 90 tablet 3   saw palmetto 500 MG capsule Take 500 mg by mouth daily.     VITAMIN E PO Take by mouth.     ALPRAZolam  (XANAX ) 0.5 MG tablet Take 1 tablet (0.5 mg total) by mouth at bedtime as needed  for anxiety (do not drive for 8 hours after taking). (Patient not taking: Reported on 06/15/2023) 30 tablet 2   [START ON 06/16/2023] rosuvastatin  (CRESTOR ) 20 MG tablet Three times a week 39 tablet 3   No current facility-administered medications for this visit.     Objective:  BP 130/62   Pulse 65   Temp (!) 97.5 F (36.4 C)   Ht 5' 10 (1.778 m)   Wt 175 lb (79.4 kg)   SpO2 98%   BMI 25.11 kg/m  Gen: NAD, resting comfortably CV: RRR no murmurs rubs or gallops Lungs: CTAB no crackles, wheeze, rhonchi Ext: no edema Skin: warm, dry     Assessment and Plan   #hypertension S: medication: Nebivolol  5 mg daily- hasn't even had yet today so likely blood pressure even better -slight weight gain- wants to get back to 170 from 175- up a few lbs with reducing to 3 days a week working from 5 and is very active at work BP Readings from Last 3 Encounters:  06/15/23 130/62  11/29/22 128/70  07/12/22 127/67  A/P: well controlled/stable- continue current medicines   #hyperlipidemia #aortic atherosclerosis S: Medication:rosuvastatin   20 mg twice a week and went up to 3 a week Lab Results  Component Value Date   CHOL 226 (H) 11/29/2022   HDL 101.30 11/29/2022   LDLCALC 108 (H) 11/29/2022   LDLDIRECT 70.0 11/13/2020   TRIG 84.0 11/29/2022   CHOLHDL 2 11/29/2022  A/P: aortic atherosclerosis (presumed stable)- LDL goal ideally <70 - working towards that by increasing rosuvastatin  to 3 days a week- check direct LDL today  #Gout- no flares in over 2 years with changing diet  #Insomnia-controlled with alprazolam  as needed- not needing right now- using sparingly   # GERD S:Medication:  very sparing Tums (mainly 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  Lab/Order associations:   ICD-10-CM   1. Essential hypertension  I10 Comprehensive metabolic panel    LDL cholesterol, direct    2. Hyperlipidemia, unspecified hyperlipidemia type  E78.5 Comprehensive metabolic panel    LDL cholesterol, direct    3. Aortic atherosclerosis (HCC)  I70.0     4. Pulmonary emphysema, unspecified emphysema type (HCC)  J43.9       Meds ordered this encounter  Medications   rosuvastatin  (CRESTOR ) 20 MG tablet    Sig: Three times a week    Dispense:  39 tablet    Refill:  3    Return precautions advised.  Garnette Lukes, MD

## 2023-06-15 NOTE — Patient Instructions (Addendum)
 Please stop by lab before you go If you have mychart- we will send your results within 3 business days of us  receiving them.  If you do not have mychart- we will call you about results within 5 business days of us  receiving them.  *please also note that you will see labs on mychart as soon as they post. I will later go in and write notes on them- will say notes from Dr. Katrinka   I agree with you on preventing more weight gain and possibly relosing those 5 lbs- intermittent fasting or calorie counting are good options  I suggest myfitnesspal Use 0.5 pounds per week weight loss goal (or up to 1 pounds if needed) Set a reasonable goal such as 5-10 lbs and can reset goal once you reach the goal Do not connect your step counter to this- watch or phone Update me in 2-3 months with how you are doing  Recommended follow up: Return in about 6 months (around 12/13/2023) for followup or sooner if needed.Schedule b4 you leave.

## 2023-06-16 ENCOUNTER — Encounter: Payer: Self-pay | Admitting: Family Medicine

## 2023-10-21 ENCOUNTER — Other Ambulatory Visit: Payer: Self-pay | Admitting: Family Medicine

## 2023-10-24 ENCOUNTER — Other Ambulatory Visit: Payer: Self-pay

## 2023-10-24 MED ORDER — NEBIVOLOL HCL 5 MG PO TABS: 5.0000 mg | ORAL_TABLET | Freq: Every day | ORAL | 3 refills | Status: DC

## 2023-12-16 ENCOUNTER — Encounter: Payer: Self-pay | Admitting: Family Medicine

## 2023-12-16 ENCOUNTER — Ambulatory Visit: Payer: Medicare Other | Admitting: Family Medicine

## 2023-12-16 ENCOUNTER — Ambulatory Visit: Payer: Self-pay | Admitting: Family Medicine

## 2023-12-16 VITALS — BP 122/70 | HR 60 | Temp 98.1°F | Ht 70.0 in | Wt 174.4 lb

## 2023-12-16 DIAGNOSIS — Z131 Encounter for screening for diabetes mellitus: Secondary | ICD-10-CM | POA: Diagnosis not present

## 2023-12-16 DIAGNOSIS — E538 Deficiency of other specified B group vitamins: Secondary | ICD-10-CM

## 2023-12-16 DIAGNOSIS — Z125 Encounter for screening for malignant neoplasm of prostate: Secondary | ICD-10-CM | POA: Diagnosis not present

## 2023-12-16 DIAGNOSIS — E785 Hyperlipidemia, unspecified: Secondary | ICD-10-CM | POA: Diagnosis not present

## 2023-12-16 DIAGNOSIS — E663 Overweight: Secondary | ICD-10-CM

## 2023-12-16 DIAGNOSIS — I10 Essential (primary) hypertension: Secondary | ICD-10-CM | POA: Diagnosis not present

## 2023-12-16 LAB — COMPREHENSIVE METABOLIC PANEL WITH GFR
ALT: 41 U/L (ref 0–53)
AST: 38 U/L — ABNORMAL HIGH (ref 0–37)
Albumin: 4.5 g/dL (ref 3.5–5.2)
Alkaline Phosphatase: 53 U/L (ref 39–117)
BUN: 11 mg/dL (ref 6–23)
CO2: 28 meq/L (ref 19–32)
Calcium: 9.3 mg/dL (ref 8.4–10.5)
Chloride: 100 meq/L (ref 96–112)
Creatinine, Ser: 0.8 mg/dL (ref 0.40–1.50)
GFR: 90.99 mL/min (ref >60.00)
Glucose, Bld: 87 mg/dL (ref 70–99)
Potassium: 5 meq/L (ref 3.5–5.1)
Sodium: 138 meq/L (ref 135–145)
Total Bilirubin: 0.9 mg/dL (ref 0.2–1.2)
Total Protein: 7.7 g/dL (ref 6.0–8.3)

## 2023-12-16 LAB — LIPID PANEL
Cholesterol: 187 mg/dL (ref 0–200)
HDL: 79.4 mg/dL (ref >39.00)
LDL Cholesterol: 92 mg/dL (ref 0–99)
NonHDL: 107.66
Total CHOL/HDL Ratio: 2
Triglycerides: 77 mg/dL (ref 0.0–149.0)
VLDL: 15.4 mg/dL (ref 0.0–40.0)

## 2023-12-16 LAB — CBC WITH DIFFERENTIAL/PLATELET
Basophils Absolute: 0 K/uL (ref 0.0–0.1)
Basophils Relative: 0.9 % (ref 0.0–3.0)
Eosinophils Absolute: 0.4 K/uL (ref 0.0–0.7)
Eosinophils Relative: 8.3 % — ABNORMAL HIGH (ref 0.0–5.0)
HCT: 44.5 % (ref 39.0–52.0)
Hemoglobin: 14.9 g/dL (ref 13.0–17.0)
Lymphocytes Relative: 35 % (ref 12.0–46.0)
Lymphs Abs: 1.8 K/uL (ref 0.7–4.0)
MCHC: 33.5 g/dL (ref 30.0–36.0)
MCV: 92.1 fl (ref 78.0–100.0)
Monocytes Absolute: 0.6 K/uL (ref 0.1–1.0)
Monocytes Relative: 11.2 % (ref 3.0–12.0)
Neutro Abs: 2.3 K/uL (ref 1.4–7.7)
Neutrophils Relative %: 44.6 % (ref 43.0–77.0)
Platelets: 216 K/uL (ref 150.0–400.0)
RBC: 4.83 Mil/uL (ref 4.22–5.81)
RDW: 12.7 % (ref 11.5–15.5)
WBC: 5.2 K/uL (ref 4.0–10.5)

## 2023-12-16 LAB — URINALYSIS, ROUTINE W REFLEX MICROSCOPIC
Bilirubin Urine: NEGATIVE
Hgb urine dipstick: NEGATIVE
Leukocytes,Ua: NEGATIVE
Nitrite: NEGATIVE
Specific Gravity, Urine: 1.01 (ref 1.000–1.030)
Total Protein, Urine: NEGATIVE
Urine Glucose: NEGATIVE
Urobilinogen, UA: 0.2 (ref 0.0–1.0)
pH: 6 (ref 5.0–8.0)

## 2023-12-16 LAB — VITAMIN B12: Vitamin B-12: 794 pg/mL (ref 211–911)

## 2023-12-16 LAB — PSA, MEDICARE: PSA: 1.01 ng/mL (ref 0.10–4.00)

## 2023-12-16 LAB — HEMOGLOBIN A1C: Hgb A1c MFr Bld: 5.7 % (ref 4.6–6.5)

## 2023-12-16 NOTE — Patient Instructions (Addendum)
 Please stop by lab before you go If you have mychart- we will send your results within 3 business days of us  receiving them.  If you do not have mychart- we will call you about results within 5 business days of us  receiving them.  *please also note that you will see labs on mychart as soon as they post. I will later go in and write notes on them- will say notes from Dr. Katrinka   Ideally limit alcohol to 2 a day- less would be even more ideal for liver health- may also help with mild weight loss  Recommended follow up: Return in about 6 months (around 06/17/2024) for followup or sooner if needed.Schedule b4 you leave.

## 2023-12-16 NOTE — Progress Notes (Signed)
 Phone [phone] In person visit   Subjective:   Angel Ellis is a 68 y.o. 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   Somatic dysfunction of spine, lumbar 01/28/2022    Priority: 1.    Medications- reviewed and updated Current Outpatient Medications  Medication Sig Dispense Refill   BIOTIN PO Take by mouth.     BLACK ELDERBERRY PO Take by mouth.     Cyanocobalamin  (B-12 PO) Take by mouth.     famotidine (PEPCID) 20 MG tablet Take 20 mg by mouth 2 (two) times daily.     MILK THISTLE PO Take by mouth.     Multiple Vitamin (MULTIVITAMIN PO) Take by mouth.     Multiple Vitamins-Minerals (EYE VITAMINS PO) Take by mouth.     nebivolol  (BYSTOLIC ) 5 MG tablet TAKE 1 TABLET(5 MG) BY MOUTH DAILY 90 tablet 3   rosuvastatin  (CRESTOR ) 20 MG tablet Three times a week 39 tablet 3   saw palmetto 500 MG capsule Take 500 mg by mouth daily.     VITAMIN E PO Take by mouth.     ALPRAZolam  (XANAX ) 0.5 MG tablet Take 1 tablet (0.5 mg  total) by mouth at bedtime as needed for anxiety (do not drive for 8 hours after taking). (Patient not taking: Reported on 06/15/2023) 30 tablet 2   No current facility-administered medications for this visit.     Objective:  BP 122/70   Pulse 60   Temp 98.1 F (36.7 C)   Ht 5' 10 (1.778 m)   Wt 174 lb 6.4 oz (79.1 kg)   SpO2 98%   BMI 25.02 kg/m  Gen: NAD, resting comfortably Mild nasal turbinate edema, some draingae in pharynx- took mucinex yesterday to help CV: RRR no murmurs rubs or gallops Lungs: CTAB no crackles, wheeze, rhonchi  Ext: no edema Skin: warm, dry Neuro: grossly normal, moves all extremities     Assessment and Plan  #social update- working 3 days a week and considering 2 . Enjoying 3 days a week though- still working hard  #hypertension S: medication: Nebivolol  5 mg daily  BP Readings from Last 3 Encounters:  12/16/23 122/70  06/15/23 130/62  11/29/22 128/70  A/P: well controlled continue current medications   #hyperlipidemia #aortic atherosclerosis #fatty liver- on mr liver 04/20/22 S: Medication:rosuvastatin  20 mg  twice a week--> three times a week Lab Results  Component Value Date   CHOL 226 (H) 11/29/2022   HDL 101.30 11/29/2022   LDLCALC 108 (H) 11/29/2022   LDLDIRECT 104.0 06/15/2023   TRIG 84.0 11/29/2022   CHOLHDL 2 11/29/2022  A/P: lipids above ideal goal but we are cautiously titrating- also check a1c to assess prediabetes risk  Fatty liver previously noted- check liver function- he remains very active and weight down 1 pounds from last visit- tries to eat reasonably healthy- tries to watch portions and cholesterol intake - advised reducing alcohol to max 2 a day- uses gingeraled down [place] but still equivalent of 3 shots- ideally none  #Gout-no flares since changing diet in 2022   #Insomnia-no medication currently  -Previously controlled with alprazolam  as needed- may need refill if has travel   # GERD S:Medication:  very  sparing Tums on top of Pepcid twice a day -[place] lunch flares up -prior omeprazole  20 mg A/P: reasonable control- continue current medications    # B12 deficiency S: Current treatment/medication (oral vs. IM): Cyanocobalamin  5000 mcg  Lab Results  Component Value Date   VITAMINB12 >1501 (H) 11/29/2022  A/P: hopefully stable- update b12 today. Continue current meds for now    #allergies- tolerates and sneezes- prefers to avoid meds  #Health maintenance -  Screening tests-  1. Colon cancer screening- history of tubular adenoma-01/29/2020 with 7 year repeat planned  2. Lung Cancer screening-  enrolled and does annually-most 03/10/2023 -Of note also had AAA ruled out 12/06/2022 3. Skin cancer screening- - saw Dr. Joshua for rosacea in past but no regular screening- may see new dermatologist if needed 4. Prostate cancer screening- low risk PSA trend- check annually through 70 at least Lab Results  Component Value Date   PSA 1.09 11/29/2022   PSA 0.93 11/26/2021   PSA 0.79 11/13/2020   Recommended follow up: Return in about 6 months (around 06/17/2024) for followup or sooner if needed.Schedule b4 you leave. Future Appointments  Date Time Provider Department Center  01/02/2024  8:40 AM [facility] ANNUAL WELLNESS VISIT 1 [facility]   Lab/Order associations:half cup of coffee with some creamer and some sugar   ICD-10-CM   1. Essential hypertension  I10 Comprehensive metabolic panel with GFR    CBC with Differential/Platelet    Lipid panel    Urinalysis, Routine w reflex microscopic    2. Hyperlipidemia, unspecified hyperlipidemia type  E78.5 Comprehensive metabolic panel with GFR    CBC with Differential/Platelet    Lipid panel    Urinalysis, Routine w reflex microscopic    3. B12 deficiency  E53.8 Vitamin B12    4. Screening for prostate cancer  Z12.5 PSA, Medicare    5. Screening for diabetes mellitus  Z13.1 Hemoglobin A1c    6. Overweight  E66.3 Hemoglobin A1c     No  orders of the defined types were placed in this encounter.  Return precautions advised.  Garnette Lukes, MD

## 2024-01-02 ENCOUNTER — Ambulatory Visit (INDEPENDENT_AMBULATORY_CARE_PROVIDER_SITE_OTHER)

## 2024-01-02 VITALS — BP 120/80 | HR 78 | Temp 98.1°F | Ht 70.5 in | Wt 174.6 lb

## 2024-01-02 DIAGNOSIS — Z0000 Encounter for general adult medical examination without abnormal findings: Secondary | ICD-10-CM

## 2024-01-02 NOTE — Progress Notes (Signed)
 Subjective:   Angel Ellis is a 68 y.o. who presents for a Medicare Wellness preventive visit.  As a reminder, Annual Wellness Visits don't include a physical exam, and some assessments may be limited, especially if this visit is performed virtually. We may recommend an in-person follow-up visit with your provider if needed.  Visit Complete: In person    Persons Participating in Visit: Patient.  AWV Questionnaire: No: Patient Medicare AWV questionnaire was not completed prior to this visit.  Cardiac Risk Factors include: advanced age (>34men, >60 women);dyslipidemia;hypertension;male gender     Objective:    Today's Vitals   01/02/24 0829  BP: 120/80  Pulse: 78  Temp: 98.1 F (36.7 C)  SpO2: 96%  Weight: 174 lb 9.6 oz (79.2 kg)  Height: 5' 10.5 (1.791 m)   Body mass index is 24.7 kg/m.     01/02/2024    8:36 AM 10/03/2015    8:33 AM 11/10/2014    6:56 AM  Advanced Directives  Does Patient Have a Medical Advance Directive? Yes Yes  No   Type of Estate agent of [place];Living will Living will    Copy of Healthcare Power of Attorney in Chart? No - copy requested       Data saved with a previous flowsheet row definition    Current Medications (verified) Outpatient Encounter Medications as of 01/02/2024  Medication Sig   ALPRAZolam  (XANAX ) 0.5 MG tablet Take 1 tablet (0.5 mg total) by mouth at bedtime as needed for anxiety (do not drive for 8 hours after taking).   BLACK ELDERBERRY PO Take by mouth.   Cyanocobalamin  (B-12 PO) Take by mouth.   famotidine (PEPCID) 20 MG tablet Take 20 mg by mouth 2 (two) times daily.   MILK THISTLE PO Take by mouth.   Multiple Vitamin (MULTIVITAMIN PO) Take by mouth.   Multiple Vitamins-Minerals (EYE VITAMINS PO) Take by mouth.   nebivolol  (BYSTOLIC ) 5 MG tablet TAKE 1 TABLET(5 MG) BY MOUTH DAILY   rosuvastatin  (CRESTOR ) 20 MG tablet Three times a week   saw palmetto 500 MG capsule Take 500 mg by mouth daily.    VITAMIN E PO Take by mouth.   BIOTIN PO Take by mouth. (Patient not taking: Reported on 01/02/2024)   [DISCONTINUED] fluticasone  (FLONASE ) 50 MCG/ACT nasal spray Place 2 sprays into both nostrils daily. (Patient not taking: Reported on 12/28/2019)   No facility-administered encounter medications on file as of 01/02/2024.    Allergies (verified) Aspirin, Milk-related compounds, Nasonex [mometasone], and Penicillins   History: Past Medical History:  Diagnosis Date   Allergy    seasonal allergies   Diverticulitis of colon 10/29/2009   After august 1995     Diverticulosis    Emphysema of lung (HCC)    not on any meds-past smoker   GERD (gastroesophageal reflux disease)    on meds   Gout    Hypertension    Lipoma of neck    L neck-remains in place   Nasal congestion    Sebaceous cyst 01/07/2009   R axilla     Past Surgical History:  Procedure Laterality Date   COLONOSCOPY  2010   TICS/hems   WISDOM TOOTH EXTRACTION  1980   Family History  Problem Relation Age of Onset   Heart disease Mother        stopped psychiatric meds and BP meds, later with MI at 64   Hypertension Mother    Schizophrenia Mother    Heart disease Father  sleep apnea not controlled and poor diet. 74.   Urinary tract infection Brother        only brother. died at 76 related to complications. states didnt care for himself   Heart attack Maternal Grandfather        died 52 but ate poorly- very fatty -  and prior smoker   Colon polyps Neg Hx    Colon cancer Neg Hx    Esophageal cancer Neg Hx    Rectal cancer Neg Hx    Stomach cancer Neg Hx    Social History   Socioeconomic History   Marital status: Single    Spouse name: Not on file   Number of children: Not on file   Years of education: Not on file   Highest education level: Not on file  Occupational History   Not on file  Tobacco Use   Smoking status: Former    Current packs/day: 0.00    Average packs/day: 1 pack/day for 37.0 years  (37.0 ttl pk-yrs)    Types: Cigarettes    Start date: 79    Quit date: 2011    Years since quitting: 14.6   Smokeless tobacco: Never  Vaping Use   Vaping status: Never Used  Substance and Sexual Activity   Alcohol use: Yes    Alcohol/week: 14.0 - 21.0 standard drinks of alcohol    Types: 14 - 21 Standard drinks or equivalent per week   Drug use: No   Sexual activity: Never  Other Topics Concern   Not on file  Social History Narrative   Domestic partner, not sexually active Irvin Lowdermilk also a patient here). No kids.       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    Social Drivers of Corporate investment banker Strain: Low Risk  (01/02/2024)   Overall Financial Resource Strain (CARDIA)    Difficulty of Paying Living Expenses: Not hard at all  Food Insecurity: No Food Insecurity (01/02/2024)   Hunger Vital Sign    Worried About Running Out of Food in the Last Year: Never true    Ran Out of Food in the Last Year: Never true  Transportation Needs: No Transportation Needs (01/02/2024)   PRAPARE - Administrator, Civil Service (Medical): No    Lack of Transportation (Non-Medical): No  Physical Activity: Sufficiently Active (01/02/2024)   Exercise Vital Sign    Days of Exercise per Week: 3 days    Minutes of Exercise per Session: 150+ min  Stress: No Stress Concern Present (01/02/2024)   [employer] of Occupational Health - Occupational Stress Questionnaire    Feeling of Stress: Not at all  Social Connections: Moderately Isolated (01/02/2024)   Social Connection and Isolation Panel    Frequency of Communication with Friends and Family: Once a week    Frequency of Social Gatherings with Friends and Family: More than three times a week    Attends Religious Services: More than 4 times per year    Active Member of [employer] or Organizations: No    Attends Banker  Meetings: Never    Marital Status: Never married    Tobacco Counseling Counseling given: Not Answered    Clinical Intake:  Pre-visit preparation completed: Yes  Pain : No/denies pain     BMI - recorded: 24.7 Nutritional Status: BMI of 19-24  Normal  Nutritional Risks: None Diabetes: No  Lab Results  Component Value Date   HGBA1C 5.7 12/16/2023     How often do you need to have someone help you when you read instructions, pamphlets, or other written materials from your doctor or pharmacy?: 1 - Never  Interpreter Needed?: No  Information entered by :: Ellouise Haws, LPN   Activities of Daily Living     01/02/2024    8:34 AM  In your present state of health, do you have any difficulty performing the following activities:  Hearing? 0  Vision? 0  Difficulty concentrating or making decisions? 0  Walking or climbing stairs? 0  Dressing or bathing? 0  Doing errands, shopping? 0  Preparing Food and eating ? N  Using the Toilet? N  In the past six months, have you accidently leaked urine? N  Do you have problems with loss of bowel control? N  Managing your Medications? N  Managing your Finances? N  Housekeeping or managing your Housekeeping? N    Patient Care Team: Katrinka Garnette KIDD, MD as PCP - General (Family Medicine)  I have updated your Care Teams any recent Medical Services you may have received from other providers in the past year.     Assessment:   This is a routine wellness examination for Even.  Hearing/Vision screen Hearing Screening - Comments:: Pt denies any hearing issues  Vision Screening - Comments:: Wears rx glasses - up to date with routine eye exams with Dr Deanne mate     Goals Addressed             This Visit's Progress    Patient Stated       Maintain health and activity        Depression Screen     01/02/2024    8:37 AM 06/15/2023    9:10 AM 11/29/2022    9:18 AM 11/26/2021    9:21 AM 11/13/2020    7:58 AM 05/12/2020     8:10 AM 11/08/2019    8:19 AM  PHQ 2/9 Scores  PHQ - 2 Score 0 0 0 0 0 0 0  PHQ- 9 Score  0 0    0    Fall Risk     01/02/2024    8:39 AM 06/15/2023    9:10 AM 11/29/2022    9:17 AM 11/26/2021    9:21 AM 11/13/2020    7:58 AM  Fall Risk   Falls in the past year? 0 0 0 0 0  Number falls in past yr: 0 0 0 0 0  Injury with Fall? 0 0 0 0 0  Risk for fall due to : No Fall Risks No Fall Risks No Fall Risks No Fall Risks No Fall Risks  Follow up Falls prevention discussed Falls evaluation completed Falls evaluation completed Education provided  Falls evaluation completed      Data saved with a previous flowsheet row definition    MEDICARE RISK AT HOME:  Medicare Risk at Home Any stairs in or around the home?: Yes If so, are there any without handrails?: No Home free of loose throw rugs in walkways, pet beds, electrical cords, etc?: Yes Adequate lighting in your home to reduce risk of falls?: Yes Life alert?: No Use of a cane, walker or w/c?: No Grab bars in the bathroom?: Yes Shower chair or bench in shower?: No Elevated toilet seat or a handicapped toilet?: No  TIMED UP AND GO:  Was the test performed?  Yes  Length of time to ambulate 10 feet: 10 sec Gait steady and fast without use of assistive device  Cognitive Function: 6CIT completed        01/02/2024    8:39 AM  6CIT Screen  What Year? 0 points  What month? 0 points  What time? 0 points  Count back from 20 0 points  Months in reverse 0 points  Repeat phrase 0 points  Total Score 0 points    Immunizations Immunization History  Administered Date(s) Administered   Influenza, High Dose Seasonal PF 02/22/2023   Influenza,inj,Quad PF,6+ Mos 01/08/2013   Influenza-Unspecified Apr 12, 1956, 02/23/2015, 02/08/2020, 02/06/2021, 01/29/2022   PFIZER(Purple Top)SARS-COV-2 Vaccination 05/02/2019, 05/23/2019   Td 08/08/2005, 07/07/2016   Tdap 10/03/2015    Screening Tests Health Maintenance  Topic Date Due   Zoster  Vaccines- Shingrix (1 of 2) 03/17/2024 (Originally 07/14/1974)   INFLUENZA VACCINE  08/07/2024 (Originally 12/09/2023)   Pneumococcal Vaccine: 50+ Years (1 of 2 - PCV) 12/15/2024 (Originally 07/14/1974)   Lung Cancer Screening  03/09/2024   Medicare Annual Wellness (AWV)  01/01/2025   DTaP/Tdap/Td (4 - Td or Tdap) 07/07/2026   Colonoscopy  01/29/2027   Hepatitis C Screening  Completed   HPV VACCINES  Aged Out   Meningococcal B Vaccine  Aged Out   COVID-19 Vaccine  Discontinued    Health Maintenance  There are no preventive care reminders to display for this patient.  Health Maintenance Items Addressed: See Nurse Notes at the end of this note  Additional Screening:  Vision Screening: Recommended annual ophthalmology exams for early detection of glaucoma and other disorders of the eye. Would you like a referral to an eye doctor? No    Dental Screening: Recommended annual dental exams for proper oral hygiene  Community Resource Referral / Chronic Care Management: CRR required this visit?  No   CCM required this visit?  No   Plan:    I have personally reviewed and noted the following in the patient's chart:   Medical and social history Use of alcohol, tobacco or illicit drugs  Current medications and supplements including opioid prescriptions. Patient is not currently taking opioid prescriptions. Functional ability and status Nutritional status Physical activity Advanced directives List of other physicians Hospitalizations, surgeries, and ER visits in previous 12 months Vitals Screenings to include cognitive, depression, and falls Referrals and appointments  In addition, I have reviewed and discussed with patient certain preventive protocols, quality metrics, and best practice recommendations. A written personalized care plan for preventive services as well as general preventive health recommendations were provided to patient.   Ellouise VEAR Haws, LPN   1/74/7974   After  Visit Summary: (In Person-Declined) Patient declined AVS at this time.  Notes: Nothing significant to report at this time.

## 2024-01-02 NOTE — Patient Instructions (Signed)
 Mr. Angel Ellis , Thank you for taking time out of your busy schedule to complete your Annual Wellness Visit with me. I enjoyed our conversation and look forward to speaking with you again next year. I, as well as your care team,  appreciate your ongoing commitment to your health goals. Please review the following plan we discussed and let me know if I can assist you in the future. Your Game plan/ To Do List    Referrals: If you haven't heard from the office you've been referred to, please reach out to them at the phone provided.   Follow up Visits: We will see or speak with you next year for your Next Medicare AWV with our clinical staff Have you seen your provider in the last 6 months (3 months if uncontrolled diabetes)? Yes  Clinician Recommendations:  Aim for 30 minutes of exercise or brisk walking, 6-8 glasses of water, and 5 servings of fruits and vegetables each day.       This is a list of the screenings recommended for you:  Health Maintenance  Topic Date Due   Medicare Annual Wellness Visit  11/29/2023   Zoster (Shingles) Vaccine (1 of 2) 03/17/2024*   Flu Shot  08/07/2024*   Pneumococcal Vaccine for age over 4 (1 of 2 - PCV) 12/15/2024*   Screening for Lung Cancer  03/09/2024   DTaP/Tdap/Td vaccine (4 - Td or Tdap) 07/07/2026   Colon Cancer Screening  01/29/2027   Hepatitis C Screening  Completed   HPV Vaccine  Aged Out   Meningitis B Vaccine  Aged Out   COVID-19 Vaccine  Discontinued  *Topic was postponed. The date shown is not the original due date.    Advanced directives: (Copy Requested) Please bring a copy of your health care power of attorney and living will to the office to be added to your chart at your convenience. You can mail to [facility] [address] [place], [place] [address] or email to [email or web address] Advance Care Planning is important because it:  [x]  Makes sure you receive the medical care that is consistent with your values,  goals, and preferences  [x]  It provides guidance to your family and loved ones and reduces their decisional burden about whether or not they are making the right decisions based on your wishes.  Follow the link provided in your after visit summary or read over the paperwork we have mailed to you to help you started getting your Advance Directives in place. If you need assistance in completing these, please reach out to us  so that we can help you!  See attachments for Preventive Care and Fall Prevention Tips.

## 2024-03-14 ENCOUNTER — Encounter (HOSPITAL_COMMUNITY): Payer: Self-pay

## 2024-03-14 ENCOUNTER — Ambulatory Visit (HOSPITAL_COMMUNITY)
Admission: RE | Admit: 2024-03-14 | Discharge: 2024-03-14 | Disposition: A | Discharge status: Home / Self Care | Source: Ambulatory Visit | Attending: Acute Care | Admitting: Acute Care

## 2024-03-14 DIAGNOSIS — Z122 Encounter for screening for malignant neoplasm of respiratory organs: Secondary | ICD-10-CM | POA: Diagnosis present

## 2024-03-14 DIAGNOSIS — Z87891 Personal history of nicotine dependence: Secondary | ICD-10-CM | POA: Insufficient documentation

## 2024-03-20 ENCOUNTER — Other Ambulatory Visit: Payer: Self-pay

## 2024-03-20 DIAGNOSIS — Z87891 Personal history of nicotine dependence: Secondary | ICD-10-CM

## 2024-03-20 DIAGNOSIS — Z122 Encounter for screening for malignant neoplasm of respiratory organs: Secondary | ICD-10-CM

## 2024-06-18 ENCOUNTER — Ambulatory Visit: Admitting: Family Medicine

## 2025-01-03 ENCOUNTER — Ambulatory Visit
